# Patient Record
Sex: Male | Born: 1940 | Race: White | Hispanic: No | Marital: Single | Smoking: Never smoker
Health system: Southern US, Community
[De-identification: ages and names within clinical notes are randomized; demographics above are authoritative.]

## PROBLEM LIST (undated history)

## (undated) DIAGNOSIS — F840 Autistic disorder: Secondary | ICD-10-CM

## (undated) DIAGNOSIS — F73 Profound intellectual disabilities: Secondary | ICD-10-CM

## (undated) DIAGNOSIS — F319 Bipolar disorder, unspecified: Secondary | ICD-10-CM

## (undated) DIAGNOSIS — F4310 Post-traumatic stress disorder, unspecified: Secondary | ICD-10-CM

## (undated) DIAGNOSIS — K5900 Constipation, unspecified: Secondary | ICD-10-CM

## (undated) DIAGNOSIS — R32 Unspecified urinary incontinence: Secondary | ICD-10-CM

## (undated) DIAGNOSIS — M0520 Rheumatoid vasculitis with rheumatoid arthritis of unspecified site: Secondary | ICD-10-CM

## (undated) DIAGNOSIS — G40909 Epilepsy, unspecified, not intractable, without status epilepticus: Secondary | ICD-10-CM

## (undated) DIAGNOSIS — R159 Full incontinence of feces: Secondary | ICD-10-CM

## (undated) HISTORY — DX: Full incontinence of feces: R15.9

## (undated) HISTORY — DX: Bipolar disorder, unspecified: F31.9

## (undated) HISTORY — PX: NO PAST SURGERIES: SHX2092

## (undated) HISTORY — DX: Unspecified urinary incontinence: R32

## (undated) HISTORY — DX: Epilepsy, unspecified, not intractable, without status epilepticus: G40.909

## (undated) HISTORY — DX: Profound intellectual disabilities: F73

## (undated) HISTORY — DX: Autistic disorder: F84.0

## (undated) HISTORY — DX: Constipation, unspecified: K59.00

## (undated) HISTORY — DX: Post-traumatic stress disorder, unspecified: F43.10

## (undated) HISTORY — DX: Rheumatoid vasculitis with rheumatoid arthritis of unspecified site: M05.20

---

## 2012-11-28 ENCOUNTER — Ambulatory Visit (HOSPITAL_COMMUNITY)
Admission: RE | Admit: 2012-11-28 | Discharge: 2012-11-28 | Disposition: A | Discharge status: Home / Self Care | Payer: Medicare Other | Source: Ambulatory Visit | Attending: Internal Medicine | Admitting: Internal Medicine

## 2012-11-28 ENCOUNTER — Other Ambulatory Visit (HOSPITAL_COMMUNITY): Payer: Self-pay | Admitting: Internal Medicine

## 2012-11-28 DIAGNOSIS — G319 Degenerative disease of nervous system, unspecified: Secondary | ICD-10-CM | POA: Insufficient documentation

## 2012-11-28 DIAGNOSIS — W19XXXA Unspecified fall, initial encounter: Secondary | ICD-10-CM

## 2012-12-05 ENCOUNTER — Other Ambulatory Visit: Payer: Self-pay | Admitting: Internal Medicine

## 2012-12-05 DIAGNOSIS — R918 Other nonspecific abnormal finding of lung field: Secondary | ICD-10-CM

## 2012-12-05 DIAGNOSIS — R531 Weakness: Secondary | ICD-10-CM

## 2012-12-08 ENCOUNTER — Ambulatory Visit
Admission: RE | Admit: 2012-12-08 | Discharge: 2012-12-08 | Disposition: A | Discharge status: Home / Self Care | Payer: Medicare Other | Source: Ambulatory Visit | Attending: Internal Medicine | Admitting: Internal Medicine

## 2012-12-08 DIAGNOSIS — R918 Other nonspecific abnormal finding of lung field: Secondary | ICD-10-CM

## 2012-12-08 DIAGNOSIS — R531 Weakness: Secondary | ICD-10-CM

## 2012-12-15 ENCOUNTER — Ambulatory Visit (INDEPENDENT_AMBULATORY_CARE_PROVIDER_SITE_OTHER): Payer: Medicare Other | Admitting: Internal Medicine

## 2012-12-15 ENCOUNTER — Encounter: Payer: Self-pay | Admitting: Internal Medicine

## 2012-12-15 VITALS — BP 122/82 | HR 89 | Wt 156.6 lb

## 2012-12-15 DIAGNOSIS — R918 Other nonspecific abnormal finding of lung field: Secondary | ICD-10-CM | POA: Insufficient documentation

## 2012-12-15 DIAGNOSIS — R222 Localized swelling, mass and lump, trunk: Secondary | ICD-10-CM

## 2012-12-15 NOTE — Progress Notes (Signed)
Subjective:     Patient ID: Alan Rhodes, male   DOB: 08-Oct-1940  MRN: [id number]  HPI  9 yowm never smoker autistic and living in Group home since Feb 2014 with baseline totally unalble to verbalize complaints but able to stand/walk on his own including sometimes walk around the block referred 12/15/2012 to pulmonary clinic for eval of ? pna referred by UC/ Dr Allyne Gee  12/15/2012 1st pulmonary ov /cc abruptly ill November 27 2012  Unable to stand s assistance, poor appetite, some cough > yellow mucus and no fever > dx pna by UC and rx levaquin starting June 20th x 10 days but no improvement seen by staff.  Swallowing ok, never noted to cough worse p eating.  No apparent wob, no vomiting.  Has had density in RLL/ RML by cxr since 2005  Sleeping ok without nocturnal  or early am exacerbation  of respiratory  c/o's or need for noct saba. Also denies any obvious fluctuation of symptoms with weather or environmental changes or other aggravating or alleviating factors except as outlined above   Current Medications, Allergies, Past Medical History, Past Surgical History, Family History, and Social History were reviewed in [employer] record.  ROS  The following are not active complaints unless bolded sore throat, dysphagia, dental problems, itching, sneezing,  nasal congestion or excess/ purulent secretions, ear ache,   fever, chills, sweats, unintended wt loss, pleuritic or exertional cp, hemoptysis,  orthopnea pnd or leg swelling, presyncope, palpitations, heartburn, abdominal pain, anorexia, nausea, vomiting, diarrhea  or change in bowel or urinary habits, change in stools or urine, dysuria,hematuria,  rash, arthralgias, visual complaints, headache, numbness weakness or ataxia or problems with walking or coordination,  change in mood/affect or memory.       Review of Systems     Objective:   Physical Exam   wc bound passive wm nad with nl vital signs, chronically ill, occ  moans but no resp to verbal o/w  HEENT: nl dentition, turbinates, and orophanx. Nl external ear canals without cough reflex   NECK :  without JVD/Nodes/TM/ nl carotid upstrokes bilaterally   LUNGS: no acc muscle use, clear to A and P bilaterally without cough on insp or exp maneuvers   CV:  RRR  no s3 or murmur or increase in P2, no edema   ABD:  soft and nontender with nl excursion in the supine position. No bruits or organomegaly, bowel sounds nl  MS:  warm without deformities, calf tenderness, cyanosis or clubbing  SKIN: warm and dry without lesions    NEURO:  No focal deficits apparent though pt not cooperative      Assessment:

## 2012-12-15 NOTE — Patient Instructions (Addendum)
Stop actonel  Please schedule a follow up office visit in 4 weeks, sooner if needed with cxr on return

## 2012-12-16 NOTE — Assessment & Plan Note (Signed)
-   never smoker - RLL/RML density present since 2005 on plain cxr - ? New Pl effusion by CT Chest 12/08/12 Heterogeneous mass-like lesion in the right middle lobe with  foci of fat and calcification, possibly similar when today's scout  view is compared with prior radiograph performed on 09/17/2006.  Additional areas of fat density in the lower lobes bilaterally.  Collectively, findings may be due to lipoid pneumonia. However,  heterogeneous mass-like appearance, location in the right middle  lobe and adjacent right pleural effusion warrant consideration of  superimposed malignancy   Most likely he is chronically aspirating with possible lipoid pna or has a benign form of rml syndrome but the only treatment option in this setting is rx with abx as he is and conservative f/u to see if R effusion needs to be tapped at some point - even this would be technically very difficult given his level of understanding and cooperation.  He is a ward of the state and informed consent is not feasible.

## 2012-12-24 ENCOUNTER — Encounter (HOSPITAL_COMMUNITY): Payer: Self-pay | Admitting: *Deleted

## 2012-12-24 ENCOUNTER — Emergency Department (HOSPITAL_COMMUNITY): Payer: Medicare Other

## 2012-12-24 ENCOUNTER — Inpatient Hospital Stay (HOSPITAL_COMMUNITY)
Admission: EM | Admit: 2012-12-24 | Discharge: 2013-01-06 | DRG: 178 | Disposition: A | Discharge status: Skilled Nursing Facility | Payer: Medicare Other | Attending: Internal Medicine | Admitting: Internal Medicine

## 2012-12-24 DIAGNOSIS — F72 Severe intellectual disabilities: Secondary | ICD-10-CM | POA: Diagnosis present

## 2012-12-24 DIAGNOSIS — K219 Gastro-esophageal reflux disease without esophagitis: Secondary | ICD-10-CM | POA: Diagnosis present

## 2012-12-24 DIAGNOSIS — J189 Pneumonia, unspecified organism: Secondary | ICD-10-CM | POA: Diagnosis present

## 2012-12-24 DIAGNOSIS — R5381 Other malaise: Secondary | ICD-10-CM | POA: Diagnosis present

## 2012-12-24 DIAGNOSIS — G40909 Epilepsy, unspecified, not intractable, without status epilepticus: Secondary | ICD-10-CM | POA: Diagnosis present

## 2012-12-24 DIAGNOSIS — E785 Hyperlipidemia, unspecified: Secondary | ICD-10-CM | POA: Diagnosis present

## 2012-12-24 DIAGNOSIS — F73 Profound intellectual disabilities: Secondary | ICD-10-CM | POA: Diagnosis present

## 2012-12-24 DIAGNOSIS — M069 Rheumatoid arthritis, unspecified: Secondary | ICD-10-CM | POA: Diagnosis present

## 2012-12-24 DIAGNOSIS — J690 Pneumonitis due to inhalation of food and vomit: Principal | ICD-10-CM | POA: Diagnosis present

## 2012-12-24 DIAGNOSIS — E872 Acidosis: Secondary | ICD-10-CM

## 2012-12-24 DIAGNOSIS — E039 Hypothyroidism, unspecified: Secondary | ICD-10-CM | POA: Diagnosis present

## 2012-12-24 DIAGNOSIS — R918 Other nonspecific abnormal finding of lung field: Secondary | ICD-10-CM

## 2012-12-24 DIAGNOSIS — F840 Autistic disorder: Secondary | ICD-10-CM | POA: Diagnosis present

## 2012-12-24 DIAGNOSIS — R1310 Dysphagia, unspecified: Secondary | ICD-10-CM

## 2012-12-24 LAB — CBC WITH DIFFERENTIAL/PLATELET
Eosinophils Absolute: 0 10*3/uL (ref 0.0–0.7)
Eosinophils Relative: 0 % (ref 0–5)
HCT: 39.5 % (ref 39.0–52.0)
Lymphocytes Relative: 7 % — ABNORMAL LOW (ref 12–46)
Lymphs Abs: 0.8 10*3/uL (ref 0.7–4.0)
MCH: 34.2 pg — ABNORMAL HIGH (ref 26.0–34.0)
MCV: 97.1 fL (ref 78.0–100.0)
Monocytes Absolute: 2.9 10*3/uL — ABNORMAL HIGH (ref 0.1–1.0)
Platelets: 227 10*3/uL (ref 150–400)
RBC: 4.07 MIL/uL — ABNORMAL LOW (ref 4.22–5.81)
RDW: 16.7 % — ABNORMAL HIGH (ref 11.5–15.5)
WBC: 10.5 10*3/uL (ref 4.0–10.5)

## 2012-12-24 LAB — URINALYSIS, ROUTINE W REFLEX MICROSCOPIC
Nitrite: NEGATIVE
Protein, ur: NEGATIVE mg/dL
Specific Gravity, Urine: 1.03 (ref 1.005–1.030)
Urobilinogen, UA: 1 mg/dL (ref 0.0–1.0)

## 2012-12-24 LAB — COMPREHENSIVE METABOLIC PANEL
ALT: 12 U/L (ref 0–53)
AST: 18 U/L (ref 0–37)
Albumin: 1.8 g/dL — ABNORMAL LOW (ref 3.5–5.2)
Calcium: 9.2 mg/dL (ref 8.4–10.5)
GFR calc Af Amer: >90 mL/min (ref >90)
Glucose, Bld: 115 mg/dL — ABNORMAL HIGH (ref 70–99)
Potassium: 4 mEq/L (ref 3.5–5.1)
Sodium: 134 mEq/L — ABNORMAL LOW (ref 135–145)
Total Protein: 5.9 g/dL — ABNORMAL LOW (ref 6.0–8.3)

## 2012-12-24 LAB — VALPROIC ACID LEVEL: Valproic Acid Lvl: 102.6 ug/mL — ABNORMAL HIGH (ref 50.0–100.0)

## 2012-12-24 LAB — URINE MICROSCOPIC-ADD ON

## 2012-12-24 LAB — CG4 I-STAT (LACTIC ACID): Lactic Acid, Venous: 3.46 mmol/L — ABNORMAL HIGH (ref 0.5–2.2)

## 2012-12-24 LAB — HIV ANTIBODY (ROUTINE TESTING W REFLEX): HIV: NONREACTIVE

## 2012-12-24 MED ORDER — DOCUSATE SODIUM 100 MG PO CAPS
100.0000 mg | ORAL_CAPSULE | Freq: Two times a day (BID) | ORAL | Status: DC
  Administered 2012-12-24 – 2013-01-06 (×24): 100 mg via ORAL
  Filled 2012-12-24 (×27): qty 1

## 2012-12-24 MED ORDER — OXYBUTYNIN CHLORIDE 5 MG PO TABS
10.0000 mg | ORAL_TABLET | Freq: Three times a day (TID) | ORAL | Status: DC
  Administered 2012-12-24 – 2013-01-06 (×39): 10 mg via ORAL
  Filled 2012-12-24 (×41): qty 2

## 2012-12-24 MED ORDER — HEPARIN SODIUM (PORCINE) 5000 UNIT/ML IJ SOLN
5000.0000 [IU] | Freq: Three times a day (TID) | INTRAMUSCULAR | Status: DC
  Administered 2012-12-24 – 2013-01-06 (×35): 5000 [IU] via SUBCUTANEOUS
  Filled 2012-12-24 (×41): qty 1

## 2012-12-24 MED ORDER — SODIUM CHLORIDE 0.9 % IV SOLN: Freq: Once | INTRAVENOUS | Status: DC

## 2012-12-24 MED ORDER — PANTOPRAZOLE SODIUM 40 MG IV SOLR
40.0000 mg | Freq: Every day | INTRAVENOUS | Status: DC
  Administered 2012-12-24 – 2012-12-26 (×3): 40 mg via INTRAVENOUS
  Filled 2012-12-24 (×9): qty 40

## 2012-12-24 MED ORDER — DIVALPROEX SODIUM 500 MG PO DR TAB
500.0000 mg | DELAYED_RELEASE_TABLET | Freq: Every day | ORAL | Status: DC
  Administered 2012-12-25 – 2012-12-26 (×2): 500 mg via ORAL
  Filled 2012-12-24 (×2): qty 1

## 2012-12-24 MED ORDER — LEVOFLOXACIN IN D5W 750 MG/150ML IV SOLN
750.0000 mg | INTRAVENOUS | Status: DC
  Administered 2012-12-25: 750 mg via INTRAVENOUS
  Filled 2012-12-24 (×4): qty 150

## 2012-12-24 MED ORDER — FOLIC ACID 1 MG PO TABS
1.0000 mg | ORAL_TABLET | Freq: Every day | ORAL | Status: DC
  Administered 2012-12-24 – 2013-01-06 (×14): 1 mg via ORAL
  Filled 2012-12-24 (×14): qty 1

## 2012-12-24 MED ORDER — DIVALPROEX SODIUM 500 MG PO DR TAB: 500.0000 mg | DELAYED_RELEASE_TABLET | Freq: Every morning | ORAL | Status: DC

## 2012-12-24 MED ORDER — SODIUM CHLORIDE 0.9 % IV BOLUS (SEPSIS)
1000.0000 mL | Freq: Once | INTRAVENOUS | Status: AC
  Administered 2012-12-24: 1000 mL via INTRAVENOUS

## 2012-12-24 MED ORDER — POLYETHYLENE GLYCOL 3350 17 G PO PACK
17.0000 g | PACK | Freq: Every day | ORAL | Status: DC
  Administered 2012-12-24 – 2013-01-06 (×14): 17 g via ORAL
  Filled 2012-12-24 (×14): qty 1

## 2012-12-24 MED ORDER — RISPERIDONE 0.5 MG PO TABS
0.5000 mg | ORAL_TABLET | Freq: Every day | ORAL | Status: DC
  Administered 2012-12-25 – 2013-01-06 (×13): 0.5 mg via ORAL
  Filled 2012-12-24 (×13): qty 1

## 2012-12-24 MED ORDER — KCL IN DEXTROSE-NACL 20-5-0.45 MEQ/L-%-% IV SOLN
INTRAVENOUS | Status: DC
  Administered 2012-12-24: 17:00:00 via INTRAVENOUS
  Administered 2012-12-25: 1000 mL via INTRAVENOUS
  Filled 2012-12-24 (×25): qty 1000

## 2012-12-24 MED ORDER — DIVALPROEX SODIUM 500 MG PO DR TAB
1000.0000 mg | DELAYED_RELEASE_TABLET | Freq: Every day | ORAL | Status: DC
  Administered 2012-12-24 – 2012-12-25 (×2): 1000 mg via ORAL
  Filled 2012-12-24 (×3): qty 2

## 2012-12-24 MED ORDER — PNEUMOCOCCAL VAC POLYVALENT 25 MCG/0.5ML IJ INJ
0.5000 mL | INJECTION | INTRAMUSCULAR | Status: AC
  Administered 2012-12-25: 0.5 mL via INTRAMUSCULAR
  Filled 2012-12-24 (×2): qty 0.5

## 2012-12-24 MED ORDER — LEVOFLOXACIN IN D5W 500 MG/100ML IV SOLN
500.0000 mg | Freq: Once | INTRAVENOUS | Status: AC
  Administered 2012-12-24: 500 mg via INTRAVENOUS
  Filled 2012-12-24: qty 100

## 2012-12-24 MED ORDER — LORAZEPAM 0.5 MG PO TABS: 0.5000 mg | ORAL_TABLET | Freq: Three times a day (TID) | ORAL | Status: DC | PRN

## 2012-12-24 MED ORDER — RISPERIDONE 0.5 MG PO TABS: 0.5000 mg | ORAL_TABLET | Freq: Every morning | ORAL | Status: DC

## 2012-12-24 MED ORDER — LEVOTHYROXINE SODIUM 25 MCG PO TABS
25.0000 ug | ORAL_TABLET | Freq: Every day | ORAL | Status: DC
  Administered 2012-12-25 – 2013-01-06 (×13): 25 ug via ORAL
  Filled 2012-12-24 (×16): qty 1

## 2012-12-24 MED ORDER — POLYETHYLENE GLYCOL 3350 17 GM/SCOOP PO POWD
17.0000 g | Freq: Every day | ORAL | Status: DC
  Filled 2012-12-24: qty 255

## 2012-12-24 MED ORDER — SIMVASTATIN 5 MG PO TABS
5.0000 mg | ORAL_TABLET | Freq: Every day | ORAL | Status: DC
  Administered 2012-12-24 – 2013-01-05 (×12): 5 mg via ORAL
  Filled 2012-12-24 (×14): qty 1

## 2012-12-24 MED ORDER — RISPERIDONE 1 MG PO TABS
1.0000 mg | ORAL_TABLET | Freq: Every day | ORAL | Status: DC
  Administered 2012-12-24 – 2013-01-05 (×13): 1 mg via ORAL
  Filled 2012-12-24 (×14): qty 1

## 2012-12-24 MED ORDER — OXCARBAZEPINE 300 MG PO TABS
300.0000 mg | ORAL_TABLET | Freq: Two times a day (BID) | ORAL | Status: DC
  Administered 2012-12-24 – 2013-01-06 (×26): 300 mg via ORAL
  Filled 2012-12-24 (×28): qty 1

## 2012-12-24 NOTE — H&P (Signed)
[facility] History and Physical  Alan Rhodes:[id number] DOB: 1940/07/15 DOA: 12/24/2012  Referring physician: Dr. Rhunette Croft PCP: Galvin Proffer, MD  Specialists: none  Chief Complaint: cough  HPI: Alan Rhodes is a 72 y.o. male With Autism, severe MR, Seizure d/o, and Rheumatoid arthritis that presents to the ED after 1 week of cough. Patient does not hold conversations with people at baseline.  Care giver reports that patient has not been as active as usual.  Reports a decrease in oral intake and a non productive cough.   While in the ED patient had chest x ray reported as new ill-defined opacity at the left base, which may represent pneumonia. Patient was treated with levaquin in the ED and we were consulted for further evaluation and recommendations for CAP.   Review of Systems: Patient with severe MR and non verbal as such unable to obtain  Past Medical History  Diagnosis Date  . Autism   . PTSD (post-traumatic stress disorder)   . Bipolar disorder, unspecified   . Profound mental retardation   . Seizure disorder   . Rheumatoid arteritis   . Constipation   . Incontinence of bowel   . Bladder incontinence    History reviewed. No pertinent past surgical history. Social History:  reports that he has never smoked. He has never used smokeless tobacco. He reports that he does not drink alcohol or use illicit drugs. Lives at assisted living facility  Can patient participate in ADLs? Not currently  No Known Allergies  History reviewed. No pertinent family history. None reported.  Prior to Admission medications   Medication Sig Start Date End Date Taking? Authorizing Provider  Calcium Carbonate-Vitamin D (CALCARB 600/D) 600-400 MG-UNIT per tablet Take 1 tablet by mouth 3 (three) times daily with meals.   Yes Historical Provider, MD  divalproex (DEPAKOTE) 500 MG DR tablet Take 500 mg by mouth every morning. And 1000 mg at bedtime   Yes Historical Provider, MD   docusate sodium (COLACE) 100 MG capsule Take 100 mg by mouth 2 (two) times daily.   Yes Historical Provider, MD  folic acid (FOLVITE) 1 MG tablet Take 1 mg by mouth daily.   Yes Historical Provider, MD  levothyroxine (SYNTHROID, LEVOTHROID) 25 MCG tablet Take 25 mcg by mouth daily before breakfast.   Yes Historical Provider, MD  LORazepam (ATIVAN) 0.5 MG tablet Take 0.5 mg by mouth 3 (three) times daily as needed for anxiety.   Yes Historical Provider, MD  lovastatin (MEVACOR) 20 MG tablet Take 20 mg by mouth at bedtime.   Yes Historical Provider, MD  methotrexate (RHEUMATREX) 2.5 MG tablet Take 12.5 mg by mouth once a week. Caution:Chemotherapy. Protect from light.  5 tabs   Yes Historical Provider, MD  OXcarbazepine (TRILEPTAL) 150 MG tablet Take 300 mg by mouth 2 (two) times daily.   Yes Historical Provider, MD  oxybutynin (DITROPAN) 5 MG tablet Take 10 mg by mouth 3 (three) times daily.   Yes Historical Provider, MD  polyethylene glycol powder (MIRALAX) powder Take 17 g by mouth daily.   Yes Historical Provider, MD  predniSONE (DELTASONE) 1 MG tablet Take 1 mg by mouth daily.   Yes Historical Provider, MD  risperiDONE (RISPERDAL) 0.5 MG tablet Take 0.5 mg by mouth every morning. And 2 tablets at bedtime   Yes Historical Provider, MD   Physical Exam: Filed Vitals:   12/24/12 1040 12/24/12 1121 12/24/12 1423  BP: 125/75  114/64  Pulse: 124 113 77  Temp: 99 F (37.2 C) 100.1 F (37.8 C)   TempSrc: Oral Rectal   Resp: 16  18  SpO2: 97%  99%     General:  Pt in NAD, Alert and Awake  Eyes: EOMI, non icteric  ENT: normal exterior appearance  Neck: supple, no goiter  Cardiovascular: RRR, no MRG  Respiratory: CTA BL, no wheezes  Abdomen: soft, NT, ND  Skin: warm and dry  Musculoskeletal: no cyanosis or clubbing  Psychiatric: unable to properly assess due to severe MR  Neurologic: unable to properly assess due to severe MR, no facial asymmetry  Labs on Admission:  Basic  Metabolic Panel:  Recent Labs Lab 12/24/12 1230  NA 134*  K 4.0  CL 101  CO2 26  GLUCOSE 115*  BUN 14  CREATININE 0.88  CALCIUM 9.2   Liver Function Tests:  Recent Labs Lab 12/24/12 1230  AST 18  ALT 12  ALKPHOS 81  BILITOT 0.3  PROT 5.9*  ALBUMIN 1.8*   No results found for this basename: LIPASE, AMYLASE,  in the last 168 hours No results found for this basename: AMMONIA,  in the last 168 hours CBC:  Recent Labs Lab 12/24/12 1137  WBC 10.5  NEUTROABS 6.7  HGB 13.9  HCT 39.5  MCV 97.1  PLT 227   Cardiac Enzymes: No results found for this basename: CKTOTAL, CKMB, CKMBINDEX, TROPONINI,  in the last 168 hours  BNP (last 3 results) No results found for this basename: PROBNP,  in the last 8760 hours CBG: No results found for this basename: GLUCAP,  in the last 168 hours  Radiological Exams on Admission: Dg Chest 2 View  12/24/2012   *RADIOLOGY REPORT*  Clinical Data: Chest pain.  CHEST - 2 VIEW  Comparison: Chest CT 12/08/2012.  Findings: Redemonstrated, circumscribed 4 cm mass in the right middle lobe.  Prior CT better characterized central fat and calcification, possibly hamartoma or lipoid pneumonia.  Ill defined opacity at the left base is new from prior.  Small bilateral pleural effusions.  Unchanged prominence of the ascending aorta, which is elongated and accentuated by rightward rotation.  No change in heart size.  Lower thoracic compression fracture, present since at least December 05, 2012.  IMPRESSION:  1.  New ill-defined opacity at the left base, which may represent pneumonia in the appropriate clinical setting.  2.  Right middle lobe mass, reference chest CT 12/08/2012. 3.  Small bilateral pleural effusions.   Original Report Authenticated By: Tiburcio Pea    Assessment/Plan Active Problems:  1. CAP - Levaquin - urine legionella and strep antigen - sputum culture if able to obtain - speech therapy evaluation to assess for aspiration  2 . Rheumatoid  arthritis - discontinue methotrexate and prednisone given # 1  3. Seizure d/o - Will go ahead and continue home regimen  4. Hypothyroidism - continue home regimen. - obtain tsh  5. HPL - stable continue statin  6. DVT prophylaxis - heparin   Code Status: presumed full caregiver was not able to specify  Family Communication: No family at bedside. Disposition Plan: Pending improvement in condition  Time spent: > 65 minutes  [name] [facility] Pager [phone]  If 7PM-7AM, please contact night-coverage [email or web address] Password [facility] 12/24/2012, 3:18 PM

## 2012-12-24 NOTE — Evaluation (Signed)
Clinical/Bedside Swallow Evaluation Patient Details  Name: Alan Rhodes MRN: [id number] Date of Birth: July 15, 1940  Today's Date: 12/24/2012 Time: 0102-7253 SLP Time Calculation (min): 28 min  Past Medical History:  Past Medical History  Diagnosis Date  . Autism   . PTSD (post-traumatic stress disorder)   . Bipolar disorder, unspecified   . Profound mental retardation   . Seizure disorder   . Rheumatoid arteritis   . Constipation   . Incontinence of bowel   . Bladder incontinence    Past Surgical History:  Past Surgical History  Procedure Laterality Date  . No past surgeries     HPI:  Alan Rhodes is a 72 y.o. male with Autism, severe MR, Seizure d/o, and Rheumatoid arthritis that presents to the ED after 1 week of cough. Patient does not hold conversations with people at baseline. Care giver reports that patient has not been as active as usual. Reports a decrease in oral intake and a non productive cough. CXR shows new opacity at left base. Dr Sherene Sires has a note in chart that reads "Most likely he is chronically aspirating with possible lipoid pna or has a benign form of rml syndrome but the only treatment option in this setting is rx with abx as he is and conservative f/u to see if R effusion needs to be tapped at some point - even this would be technically very difficult given his level of understanding and cooperation.  He is a ward of the state and informed consent is not feasible."   Assessment / Plan / Recommendation Clinical Impression  Pt demonstrates signs concerning for an esophageal dysphagia. Initially when being fed by caregiver, pts swallow function appears [facility]. After 5 bites and sips, pt begins coughing, gagging and belching. Suspect pt may have sensation of reflux which could also be contributing factor in pna. Caregivers confirm that pt has had a frequent cough even before pna became a problem. Suggest pt initate a puree/thin liquid diet with esophageal  precautions.  Discussed with MD who will place on diet. Provided verbal and written suggestions to caregiver. Will f/u for further differential dx of pharyngeal vs esophageal dysphagia and education with caregivers.     Aspiration Risk  Moderate    Diet Recommendation Dysphagia 1 (Puree);Thin liquid   Liquid Administration via: Cup Medication Administration: Whole meds with puree Supervision: Trained caregiver to feed patient;Staff feed patient Compensations: Slow rate;Small sips/bites;Follow solids with liquid Postural Changes and/or Swallow Maneuvers: Seated upright 90 degrees;Upright 30-60 min after meal    Other  Recommendations Oral Care Recommendations: Oral care BID   Follow Up Recommendations  None    Frequency and Duration min 2x/week  2 weeks   Pertinent Vitals/Pain NA    SLP Swallow Goals Patient will utilize recommended strategies during swallow to increase swallowing safety with: Total assistance Swallow Study Goal #2 - Progress: Progressing toward goal   Swallow Study Prior Functional Status       General HPI: Alan Rhodes is a 72 y.o. male with Autism, severe MR, Seizure d/o, and Rheumatoid arthritis that presents to the ED after 1 week of cough. Patient does not hold conversations with people at baseline. Care giver reports that patient has not been as active as usual. Reports a decrease in oral intake and a non productive cough. CXR shows new opacity at left base. Dr Sherene Sires has a note in chart that reads "Most likely he is chronically aspirating with possible lipoid pna or has a benign  form of rml syndrome but the only treatment option in this setting is rx with abx as he is and conservative f/u to see if R effusion needs to be tapped at some point - even this would be technically very difficult given his level of understanding and cooperation.  He is a ward of the state and informed consent is not feasible." Type of Study: Bedside swallow evaluation Previous  Swallow Assessment: none Diet Prior to this Study: Dysphagia 1 (puree);Thin liquids Temperature Spikes Noted: Yes Respiratory Status: Room air History of Recent Intubation: No Behavior/Cognition: Alert;Cooperative;Requires cueing;Pleasant mood Self-Feeding Abilities: Total assist Patient Positioning: Upright in bed Baseline Vocal Quality: Clear Volitional Cough: Cognitively unable to elicit Volitional Swallow: Unable to elicit    Oral/Motor/Sensory Function Overall Oral Motor/Sensory Function: Other (comment) (Did not attempt)   Ice Chips     Thin Liquid Thin Liquid: Impaired Presentation: Cup Oral Phase Functional Implications: Prolonged oral transit;Oral holding Pharyngeal  Phase Impairments: Cough - Delayed    Nectar Thick Nectar Thick Liquid: Not tested   Honey Thick Honey Thick Liquid: Not tested   Puree Puree: Impaired Presentation: Spoon Oral Phase Functional Implications: Prolonged oral transit;Oral holding Pharyngeal Phase Impairments: Cough - Delayed   Solid   GO    Solid: Not tested      Harlon Ditty, MA CCC-SLP [phone]  Claudine Mouton 12/24/2012,4:48 PM

## 2012-12-24 NOTE — Progress Notes (Signed)
ANTIBIOTIC CONSULT NOTE - INITIAL  Pharmacy Consult for Levaquin, Antibiotic renal dose adjustment Indication: pneumonia  No Known Allergies  Patient Measurements: Height: 5\' 3"  (160 cm) Weight: 154 lb 8.7 oz (70.1 kg) IBW/kg (Calculated) : 56.9 Adjusted Body Weight:   Vital Signs: Temp: 97.8 F (36.6 C) (07/09 1536) Temp src: Oral (07/09 1536) BP: 125/80 mmHg (07/09 1536) Pulse Rate: 78 (07/09 1536) Intake/Output from previous day:   Intake/Output from this shift:    Labs:  Recent Labs  12/24/12 1137 12/24/12 1230  WBC 10.5  --   HGB 13.9  --   PLT 227  --   CREATININE  --  0.88   Estimated Creatinine Clearance: 68.7 ml/min (by C-G formula based on Cr of 0.88). No results found for this basename: VANCOTROUGH, VANCOPEAK, VANCORANDOM, GENTTROUGH, GENTPEAK, GENTRANDOM, TOBRATROUGH, TOBRAPEAK, TOBRARND, AMIKACINPEAK, AMIKACINTROU, AMIKACIN,  in the last 72 hours   Microbiology: No results found for this or any previous visit (from the past 720 hour(s)).  Medical History: Past Medical History  Diagnosis Date  . Autism   . PTSD (post-traumatic stress disorder)   . Bipolar disorder, unspecified   . Profound mental retardation   . Seizure disorder   . Rheumatoid arteritis   . Constipation   . Incontinence of bowel   . Bladder incontinence     Assessment: 47 yoM presents to ED after 1 week of cough.  ED CXR shows opacity which may represent pneumonia.  Starting Levaquin for CAP.  Received 500 mg IV Levaquin in ED.  Afebrile, WBC 10.5  Renal function WNL.  CrCl~68 ml/min.  Goal of Therapy:  Eradication of infection  Plan:  Levaquin 750 mg IV q24h.   Clance Boll 12/24/2012,4:37 PM

## 2012-12-24 NOTE — ED Provider Notes (Addendum)
History    CSN: [id number] Arrival date & time 12/24/12  1022  First MD Initiated Contact with Patient 12/24/12 1054     Chief Complaint  Patient presents with  . weakness, change in mental status since June    (Consider location/radiation/quality/duration/timing/severity/associated sxs/prior Treatment) HPI Comments: LEVEL 5 CAVEAT FOR INABILITY TO COMMUNICATE DUE TO AUTISM 72 y/o male, hx of autism, living at a group home, brought in to the ED by the group home with cc of abd pain. Per group home representative, patient has been getting worse over the past 1 month. He was seen at urgent care, and was treated with levaquin for possible PNA. The patient has been not eating or drinking, has decreased urine output, and lays in the bed for entire day. He is weak, not ambulating as he usually does, and hardly talks. No falls.   The history is provided by a caregiver.   Past Medical History  Diagnosis Date  . Autism   . PTSD (post-traumatic stress disorder)   . Bipolar disorder, unspecified   . Profound mental retardation   . Seizure disorder   . Rheumatoid arteritis   . Constipation   . Incontinence of bowel   . Bladder incontinence    History reviewed. No pertinent past surgical history. History reviewed. No pertinent family history. History  Substance Use Topics  . Smoking status: Never Smoker   . Smokeless tobacco: Never Used  . Alcohol Use: No    Review of Systems  Unable to perform ROS: Other    Allergies  Review of patient's allergies indicates no known allergies.  Home Medications   Current Outpatient Rx  Name  Route  Sig  Dispense  Refill  . Calcium Carbonate-Vitamin D (CALCARB 600/D) 600-400 MG-UNIT per tablet   Oral   Take 1 tablet by mouth 3 (three) times daily with meals.         . divalproex (DEPAKOTE) 500 MG DR tablet   Oral   Take 500 mg by mouth every morning. And 1000 mg at bedtime         . docusate sodium (COLACE) 100 MG capsule   Oral  Take 100 mg by mouth 2 (two) times daily.         . folic acid (FOLVITE) 1 MG tablet   Oral   Take 1 mg by mouth daily.         Marland Kitchen levothyroxine (SYNTHROID, LEVOTHROID) 25 MCG tablet   Oral   Take 25 mcg by mouth daily before breakfast.         . LORazepam (ATIVAN) 0.5 MG tablet   Oral   Take 0.5 mg by mouth 3 (three) times daily as needed for anxiety.         . lovastatin (MEVACOR) 20 MG tablet   Oral   Take 20 mg by mouth at bedtime.         . methotrexate (RHEUMATREX) 2.5 MG tablet   Oral   Take 12.5 mg by mouth once a week. Caution:Chemotherapy. Protect from light.  5 tabs         . OXcarbazepine (TRILEPTAL) 150 MG tablet   Oral   Take 300 mg by mouth 2 (two) times daily.         Marland Kitchen oxybutynin (DITROPAN) 5 MG tablet   Oral   Take 10 mg by mouth 3 (three) times daily.         . polyethylene glycol powder (MIRALAX) powder   Oral  Take 17 g by mouth daily.         . predniSONE (DELTASONE) 1 MG tablet   Oral   Take 1 mg by mouth daily.         . risperiDONE (RISPERDAL) 0.5 MG tablet   Oral   Take 0.5 mg by mouth every morning. And 2 tablets at bedtime          BP 125/75  Pulse 113  Temp(Src) 100.1 F (37.8 C) (Rectal)  Resp 16  SpO2 97% Physical Exam  Nursing note and vitals reviewed. HENT:  Head: Normocephalic.  Eyes: EOM are normal.  Neck: Neck supple. No JVD present.  Cardiovascular: Regular rhythm.   No murmur heard. Pulmonary/Chest: No respiratory distress.  Abdominal: Soft. He exhibits no distension. There is no tenderness.  Musculoskeletal: He exhibits no edema.  Neurological: He is alert.  Skin: No rash noted.    ED Course  Procedures (including critical care time) Labs Reviewed  CBC WITH DIFFERENTIAL - Abnormal; Notable for the following:    RBC 4.07 (*)    MCH 34.2 (*)    RDW 16.7 (*)    Lymphocytes Relative 7 (*)    Monocytes Relative 28 (*)    Monocytes Absolute 2.9 (*)    All other components within normal  limits  CG4 I-STAT (LACTIC ACID) - Abnormal; Notable for the following:    Lactic Acid, Venous 3.46 (*)    All other components within normal limits  URINE CULTURE  CULTURE, BLOOD (ROUTINE X 2)  CULTURE, BLOOD (ROUTINE X 2)  URINALYSIS, ROUTINE W REFLEX MICROSCOPIC  COMPREHENSIVE METABOLIC PANEL   No results found. No diagnosis found.  MDM  DDx: Sepsis syndrome ACS syndrome DKA ICH/Stroke Infection - pneumonia/UTI/Cellulitis PE Dehydration Electrolyte abnormality Tox syndrome  Pt comes in with cc of weakness. Tachycardic here. Unable to provide hx, and exam not suggestive of any infection. No rash, pressure ulcers appreciated.  Group home indicates that he has had decreased po intake with the weakness. They feel ill equipped to take care of him, as he continues to decline in front of them.  We will get basic screening labs. Will hydrate. Group home doesn't feel comfortable taking care of him, so might need admission and SNF placement.    Derwood Kaplan, MD 12/24/12 1304   Date: 12/24/2012  Rate: 92  Rhythm: normal sinus rhythm  QRS Axis: normal  Intervals: normal  ST/T Wave abnormalities: normal  Conduction Disutrbances: none  Narrative Interpretation: unremarkable      Derwood Kaplan, MD 12/24/12 1322

## 2012-12-24 NOTE — ED Notes (Addendum)
Staff from [facility] reports pt was able to walk, talk, alert and oriented x4. June 12 pt taken to urgent care for altered mental status, not eating, difficulty walking, sob when walking. At urgent care dx with UTI and pneumonia. Given abx and treated. Pt status did not improve. Facility was required to wait 1 week before bringing pt to ED. Pt at present not alert and oriented, unable to walk or sit up straight, cough present.

## 2012-12-24 NOTE — Progress Notes (Signed)
Pt unable to sign up for My Chart. Proxy forms were given to care giver to take back to group home in case anyone there is eligible to sign up for My Chart for patient.  Briscoe Burns BSN, RN-BC Admissions RN  12/24/2012 4:18 PM

## 2012-12-24 NOTE — ED Notes (Signed)
Critical I stat Lactic - [place] reported to Dr Rhunette Croft 1200

## 2012-12-25 LAB — BASIC METABOLIC PANEL
BUN: 11 mg/dL (ref 6–23)
Calcium: 8.6 mg/dL (ref 8.4–10.5)
Creatinine, Ser: 0.88 mg/dL (ref 0.50–1.35)
GFR calc non Af Amer: 85 mL/min — ABNORMAL LOW (ref >90)
Glucose, Bld: 85 mg/dL (ref 70–99)
Sodium: 134 mEq/L — ABNORMAL LOW (ref 135–145)

## 2012-12-25 LAB — CBC
HCT: 32.7 % — ABNORMAL LOW (ref 39.0–52.0)
Hemoglobin: 11.4 g/dL — ABNORMAL LOW (ref 13.0–17.0)
MCH: 34.5 pg — ABNORMAL HIGH (ref 26.0–34.0)
MCH: 33.9 pg (ref 26.0–34.0)
MCV: 97.1 fL (ref 78.0–100.0)
MCV: 97.3 fL (ref 78.0–100.0)
RBC: 2.78 MIL/uL — ABNORMAL LOW (ref 4.22–5.81)
RBC: 3.36 MIL/uL — ABNORMAL LOW (ref 4.22–5.81)

## 2012-12-25 LAB — URINE CULTURE
Colony Count: NO GROWTH
Culture: NO GROWTH

## 2012-12-25 MED ORDER — ENSURE COMPLETE PO LIQD
237.0000 mL | Freq: Three times a day (TID) | ORAL | Status: DC
  Administered 2012-12-25 – 2013-01-05 (×26): 237 mL via ORAL

## 2012-12-25 NOTE — Progress Notes (Signed)
INITIAL NUTRITION ASSESSMENT  DOCUMENTATION CODES Per approved criteria  -Not Applicable   INTERVENTION: Provide Ensure Complete TID  NUTRITION DIAGNOSIS: Inadequate oral intake related to medical condition as evidenced by pt eating 0-50% of meals.   Goal: Pt to meet >/= 90% of their estimated nutrition needs  Monitor:  PO intake Weight Labs  Reason for Assessment: Malnutrition Screening Tool, score of 2  72 y.o. male  Admitting Dx: CAP (community acquired pneumonia)  ASSESSMENT: 72 y.o. male with Autism, severe MR, Seizure d/o, and Rheumatoid arthritis that presents to the ED after 1 week of cough. Patient does not hold conversations with people at baseline. Care giver reports that patient has not been as active as usual. Reports a decrease in oral intake and a non productive cough.  Per RN pt refused to eat breakfast. PT was working with pt at time of visit and reports she fed pt a few bites from breakfast tray. Per nursing notes pt ate 50% of lunch. Per MST report pt has lost 7 lbs in the past 3 weeks.  Height: Ht Readings from Last 1 Encounters:  12/24/12 5\' 3"  (1.6 m)    Weight: Wt Readings from Last 1 Encounters:  12/24/12 154 lb 8.7 oz (70.1 kg)    Ideal Body Weight: 124 lbs  % Ideal Body Weight: 124%  Wt Readings from Last 10 Encounters:  12/24/12 154 lb 8.7 oz (70.1 kg)  12/15/12 156 lb 9.6 oz (71.033 kg)    Usual Body Weight: 161 lbs  % Usual Body Weight: 96%  BMI:  Body mass index is 27.38 kg/(m^2).  Estimated Nutritional Needs: Kcal: 1620-1890 Protein: 70-84 grams Fluid: 2.1 L  Skin: WDL  Diet Order: Dysphagia  EDUCATION NEEDS: -No education needs identified at this time   Intake/Output Summary (Last 24 hours) at 12/25/12 1432 Last data filed at 12/25/12 1231  Gross per 24 hour  Intake 2051.25 ml  Output      0 ml  Net 2051.25 ml    Last BM: 7/10  Labs:   Recent Labs Lab 12/24/12 1230 12/25/12 0502  NA 134* 134*  K 4.0  4.0  CL 101 103  CO2 26 27  BUN 14 11  CREATININE 0.88 0.88  CALCIUM 9.2 8.6  GLUCOSE 115* 85    CBG (last 3)  No results found for this basename: GLUCAP,  in the last 72 hours  Scheduled Meds: . sodium chloride   Intravenous Once  . divalproex  500 mg Oral Daily   And  . divalproex  1,000 mg Oral QHS  . docusate sodium  100 mg Oral BID  . folic acid  1 mg Oral Daily  . heparin  5,000 Units Subcutaneous Q8H  . levofloxacin (LEVAQUIN) IV  750 mg Intravenous Q24H  . levothyroxine  25 mcg Oral QAC breakfast  . OXcarbazepine  300 mg Oral BID  . oxybutynin  10 mg Oral TID  . pantoprazole (PROTONIX) IV  40 mg Intravenous Daily  . polyethylene glycol  17 g Oral Daily  . risperiDONE  0.5 mg Oral Daily   And  . risperiDONE  1 mg Oral QHS  . simvastatin  5 mg Oral q1800    Continuous Infusions: . dextrose 5 % and 0.45 % NaCl with KCl 20 mEq/L 1,000 mL (12/25/12 0550)    Past Medical History  Diagnosis Date  . Autism   . PTSD (post-traumatic stress disorder)   . Bipolar disorder, unspecified   . Profound mental retardation   .  Seizure disorder   . Rheumatoid arteritis   . Constipation   . Incontinence of bowel   . Bladder incontinence     Past Surgical History  Procedure Laterality Date  . No past surgeries      Ian Malkin RD, LDN Inpatient Clinical Dietitian Pager: [phone] After Hours Pager: [phone]

## 2012-12-25 NOTE — Progress Notes (Signed)
[facility] PROGRESS NOTE  Alan Rhodes XBM:[id number] DOB: 1940-09-05 DOA: 12/24/2012 PCP: Galvin Proffer, MD  Assessment/Plan:  1. CAP - Levaquin  - urine legionella antigen pending and strep antigen negative - sputum culture if able to obtain  - speech therapy evaluation completed and dysphagia 1 diet recommended.  Given this findings I suspect that aspiration may be a possible cause. - Also treating for GERD as this may be contributing to clinical picture. - Physical therapy has evaluated patient and is currently recommending SNF  2 . Rheumatoid arthritis  - Will continue to hold methotrexate and prednisone given # 1   3. Seizure d/o  - Will go ahead and continue home regimen   4. Hypothyroidism  - continue home regimen.  - obtain tsh   5. HPL  - stable continue statin   6. DVT prophylaxis  - heparin  Code Status: presumed full Family Communication: no family at bedside Disposition Plan: Will plan on discharging to SNF   Consultants:  Physical therapy  Speech therapy  Procedures:  none  Antibiotics:  Levaquin   HPI/Subjective: No acute issues reported overnight.  Objective: Filed Vitals:   12/24/12 1536 12/24/12 2157 12/25/12 0600 12/25/12 1348  BP: 125/80 109/76 100/59 104/72  Pulse: 78 62 69 74  Temp: 97.8 F (36.6 C) 98.4 F (36.9 C) 98.6 F (37 C) 97.9 F (36.6 C)  TempSrc: Oral Oral Oral Oral  Resp: 18 16 18 16   Height: 5\' 3"  (1.6 m)     Weight: 70.1 kg (154 lb 8.7 oz)     SpO2: 97% 96% 94% 97%    Intake/Output Summary (Last 24 hours) at 12/25/12 1407 Last data filed at 12/25/12 1231  Gross per 24 hour  Intake 2051.25 ml  Output      0 ml  Net 2051.25 ml   Filed Weights   12/24/12 1536  Weight: 70.1 kg (154 lb 8.7 oz)    Exam:   General:  Pt in NAD, Alert and Awake  Cardiovascular: RRR, no MRG  Respiratory: No wheezes, no increased work of breathing.  Abdomen: soft, NT, ND  Musculoskeletal: no cyanosis or  clubbing   Data Reviewed: Basic Metabolic Panel:  Recent Labs Lab 12/24/12 1230 12/25/12 0502  NA 134* 134*  K 4.0 4.0  CL 101 103  CO2 26 27  GLUCOSE 115* 85  BUN 14 11  CREATININE 0.88 0.88  CALCIUM 9.2 8.6   Liver Function Tests:  Recent Labs Lab 12/24/12 1230  AST 18  ALT 12  ALKPHOS 81  BILITOT 0.3  PROT 5.9*  ALBUMIN 1.8*   No results found for this basename: LIPASE, AMYLASE,  in the last 168 hours No results found for this basename: AMMONIA,  in the last 168 hours CBC:  Recent Labs Lab 12/24/12 1137 12/25/12 0502 12/25/12 1238  WBC 10.5 8.9 9.4  NEUTROABS 6.7  --   --   HGB 13.9 9.6* 11.4*  HCT 39.5 27.0* 32.7*  MCV 97.1 97.1 97.3  PLT 227 125* 148*   Cardiac Enzymes: No results found for this basename: CKTOTAL, CKMB, CKMBINDEX, TROPONINI,  in the last 168 hours BNP (last 3 results) No results found for this basename: PROBNP,  in the last 8760 hours CBG: No results found for this basename: GLUCAP,  in the last 168 hours  Recent Results (from the past 240 hour(s))  CULTURE, BLOOD (ROUTINE X 2)     Status: None   Collection Time  12/24/12 11:43 AM      Result Value Range Status   Specimen Description BLOOD RIGHT FOREARM   Final   Special Requests BOTTLES DRAWN AEROBIC AND ANAEROBIC 5 CC EACH   Final   Culture  Setup Time 12/24/2012 14:15   Final   Culture     Final   Value:        BLOOD CULTURE RECEIVED NO GROWTH TO DATE CULTURE WILL BE HELD FOR 5 DAYS BEFORE ISSUING A FINAL NEGATIVE REPORT   Report Status PENDING   Incomplete  CULTURE, BLOOD (ROUTINE X 2)     Status: None   Collection Time    12/24/12 12:30 PM      Result Value Range Status   Specimen Description BLOOD LEFT ARM   Final   Special Requests BOTTLES DRAWN AEROBIC AND ANAEROBIC 5 CC EACH   Final   Culture  Setup Time 12/24/2012 14:16   Final   Culture     Final   Value:        BLOOD CULTURE RECEIVED NO GROWTH TO DATE CULTURE WILL BE HELD FOR 5 DAYS BEFORE ISSUING A FINAL  NEGATIVE REPORT   Report Status PENDING   Incomplete     Studies: Dg Chest 2 View  12/24/2012   *RADIOLOGY REPORT*  Clinical Data: Chest pain.  CHEST - 2 VIEW  Comparison: Chest CT 12/08/2012.  Findings: Redemonstrated, circumscribed 4 cm mass in the right middle lobe.  Prior CT better characterized central fat and calcification, possibly hamartoma or lipoid pneumonia.  Ill defined opacity at the left base is new from prior.  Small bilateral pleural effusions.  Unchanged prominence of the ascending aorta, which is elongated and accentuated by rightward rotation.  No change in heart size.  Lower thoracic compression fracture, present since at least December 05, 2012.  IMPRESSION:  1.  New ill-defined opacity at the left base, which may represent pneumonia in the appropriate clinical setting.  2.  Right middle lobe mass, reference chest CT 12/08/2012. 3.  Small bilateral pleural effusions.   Original Report Authenticated By: Tiburcio Pea    Scheduled Meds: . sodium chloride   Intravenous Once  . divalproex  500 mg Oral Daily   And  . divalproex  1,000 mg Oral QHS  . docusate sodium  100 mg Oral BID  . folic acid  1 mg Oral Daily  . heparin  5,000 Units Subcutaneous Q8H  . levofloxacin (LEVAQUIN) IV  750 mg Intravenous Q24H  . levothyroxine  25 mcg Oral QAC breakfast  . OXcarbazepine  300 mg Oral BID  . oxybutynin  10 mg Oral TID  . pantoprazole (PROTONIX) IV  40 mg Intravenous Daily  . polyethylene glycol  17 g Oral Daily  . risperiDONE  0.5 mg Oral Daily   And  . risperiDONE  1 mg Oral QHS  . simvastatin  5 mg Oral q1800   Continuous Infusions: . dextrose 5 % and 0.45 % NaCl with KCl 20 mEq/L 1,000 mL (12/25/12 0550)    Principal Problem:   CAP (community acquired pneumonia) Active Problems:   MR (mental retardation), severe   Seizure disorder   Autism   Rheumatoid arthritis    Time spent: > 35 minutes    [name]  [facility] Pager [phone]. If 7PM-7AM, please  contact night-coverage at [email or web address], password [facility] 12/25/2012, 2:07 PM  LOS: 1 day

## 2012-12-25 NOTE — Clinical Social Work Placement (Addendum)
     Clinical Social Work Department CLINICAL SOCIAL WORK PLACEMENT NOTE 01/06/2013  Patient:  Alan Rhodes, Alan Rhodes  Account Number:  [id number] Admit date:  12/24/2012  Clinical Social Worker:  Becky Sax, LCSW  Date/time:  12/25/2012 12:00 M  Clinical Social Work is seeking post-discharge placement for this patient at the following level of care:   SKILLED NURSING   (*CSW will update this form in Epic as items are completed)   12/25/2012  Patient/family provided with [facility] Department of Clinical Social Works list of facilities offering this level of care within the geographic area requested by the patient (or if unable, by the patients family).  12/25/2012  Patient/family informed of their freedom to choose among providers that offer the needed level of care, that participate in Medicare, Medicaid or managed care program needed by the patient, have an available bed and are willing to accept the patient.  12/25/2012  Patient/family informed of [facility] ownership interest in [facility], as well as of the fact that they are under no obligation to receive care at this facility.  PASARR submitted to EDS on 12/25/2012 PASARR number received from [employer] on 01/06/2013  FL2 transmitted to all facilities in geographic area requested by pt/family on  12/25/2012 FL2 transmitted to all facilities within larger geographic area on   Patient informed that his/her managed care company has contracts with or will negotiate with  certain facilities, including the following:     Patient/family informed of bed offers received:  01/06/2013 Patient chooses bed at [facility] Physician recommends and patient chooses bed at  [facility]  Patient to be transferred to [facility] on  01/06/2013 Patient to be transferred to facility by ptar  The following physician request were entered in Epic:   Additional Comments:

## 2012-12-25 NOTE — Evaluation (Addendum)
Physical Therapy Evaluation Patient Details Name: Alan Rhodes MRN: [id number] DOB: 1940/09/13 Today's Date: 12/25/2012 Time: 1040-1100 PT Time Calculation (min): 20 min  PT Assessment / Plan / Recommendation History of Present Illness  72 y.o. male with h/o autism, profound mental retardation, seizures, RA, bipolar, incontinent of bowel and bladder, PTSD admitted from group home with pneumonia. Spoke to caregiver at group home who stated pt was ambulatory at group home without assistance or assistive device prior to admission.   Clinical Impression  *+2 total assist for supine to sit and to pivot to chair, per caregiver pt was ambulatory at group home. Will likely need ST- SNF. He would benefit from acute PT to maximize safety and independence with mobility. **    PT Assessment  Patient needs continued PT services    Follow Up Recommendations  SNF (depending on progress and level of assist available )    Does the patient have the potential to tolerate intense rehabilitation      Barriers to Discharge        Equipment Recommendations  Rolling walker with 5" wheels    Recommendations for Other Services     Frequency Min 3X/week    Precautions / Restrictions Precautions Precautions: Fall Restrictions Weight Bearing Restrictions: No   Pertinent Vitals/Pain *no signs/symptoms of pain**      Mobility  Bed Mobility Bed Mobility: Supine to Sit Supine to Sit: 1: +2 Total assist Supine to Sit: Patient Percentage: 20% Details for Bed Mobility Assistance: assist to advance BLEs and to raise trunk Transfers Transfers: Sit to Stand;Stand to Sit;Stand Pivot Transfers Sit to Stand: 1: +2 Total assist;From chair/3-in-1;From bed Sit to Stand: Patient Percentage: 30% Stand to Sit: 1: +2 Total assist;To chair/3-in-1 Stand to Sit: Patient Percentage: 30% Stand Pivot Transfers: 1: +2 Total assist Stand Pivot Transfers: Patient Percentage: 30% Details for Transfer Assistance:  assist to rise, pt stood with RW for 2 min for pericare 2* incontinence of bowel and required mod assist 2* posterior lean Ambulation/Gait Ambulation/Gait Assistance: Not tested (comment)    Exercises     PT Diagnosis: Difficulty walking;Generalized weakness  PT Problem List: Decreased strength;Decreased activity tolerance;Decreased balance;Decreased mobility;Decreased safety awareness PT Treatment Interventions: Gait training;Functional mobility training;DME instruction;Therapeutic activities;Patient/family education     PT Goals(Current goals can be found in the care plan section) Acute Rehab PT Goals Patient Stated Goal: pt unable PT Goal Formulation: Patient unable to participate in goal setting Time For Goal Achievement: 01/08/13 Potential to Achieve Goals: Fair  Visit Information  Last PT Received On: 12/25/12 Assistance Needed: +2 History of Present Illness: 72 y.o. male with h/o autism, profound mental retardation, seizures, RA, bipolar, incontinent of bowel and bladder, PTSD admitted from group home with pneumonia. Per chart pt was ambulatory at group home PTA.        Prior Functioning  Home Living Family/patient expects to be discharged to:: Group home Prior Function Comments: per ED notes pt was ambulatory at group home  Communication Communication: Expressive difficulties    Cognition  Cognition Arousal/Alertness: Awake/alert Behavior During Therapy: WFL for tasks assessed/performed Overall Cognitive Status: No family/caregiver present to determine baseline cognitive functioning (h/o autism and mental retardation)    Extremity/Trunk Assessment Lower Extremity Assessment Lower Extremity Assessment: Generalized weakness (knee/hip AAROM WNL, ankle DF to 0*, some active movement) Cervical / Trunk Assessment Cervical / Trunk Assessment: Normal   Balance Balance Balance Assessed: Yes Static Standing Balance Static Standing - Balance Support: Bilateral upper  extremity supported Static  Standing - Level of Assistance: 3: Mod assist Static Standing - Comment/# of Minutes: 2 minutes, assist 2* posterior lean  End of Session PT - End of Session Equipment Utilized During Treatment: Gait belt Activity Tolerance: Patient tolerated treatment well Patient left: in chair;with call bell/phone within reach;with chair alarm set Nurse Communication: Mobility status  GP     Ralene Bathe Kistler 12/25/2012, 11:16 AM [phone]

## 2012-12-25 NOTE — Progress Notes (Signed)
Speech Language Pathology Dysphagia Treatment Patient Details Name: Alan Rhodes MRN: [id number] DOB: February 15, 1941 Today's Date: 12/25/2012 Time: 1540-1601 SLP Time Calculation (min): 21 min  Assessment / Plan / Recommendation Clinical Impression  Pt seen for skilled dysphagia session, pt alone in room, SLP helped him to consume approx 4 ounces icecream and 2 ounces ice water.   Delayed swallow noted most notably with icecream - up to 25 seconds without pt triggering a swallow.  Pt given water via straw to facilitate swallow - this triggered a swallow.  Mild amount of coughing with intake noted - could possibly be secondary due to aspiration.  Pt was noted to belch upon SLP entrance to room before intake given.   Rec continue diet with strict aspiration precautions.      Diet Recommendation  Continue with Current Diet: Dysphagia 1 (puree);Thin liquid    SLP Plan   follow up at SNF  Pertinent Vitals/Pain Afebrile, decreased   Swallowing Goals  SLP Swallowing Goals Patient will utilize recommended strategies during swallow to increase swallowing safety with: Total assistance  General Temperature Spikes Noted: No Respiratory Status: Room air Behavior/Cognition: Alert;Cooperative;Doesn't follow directions;Pleasant mood;Confused Oral Cavity - Dentition: Edentulous Patient Positioning: Upright in chair  Oral Cavity - Oral Hygiene   n/a  Dysphagia Treatment Treatment focused on: Skilled observation of diet tolerance Treatment Methods/Modalities: Skilled observation;Differential diagnosis Patient observed directly with PO's: Yes Type of PO's observed: Dysphagia 1 (puree);Thin liquids (icecream) Feeding: Needs assist;Total assist (able to hold spoon with encouragement) Liquids provided via: Straw Pharyngeal Phase Signs & Symptoms: Suspected delayed swallow initiation;Delayed cough Type of cueing:  (pt is not following directions)   GO    [name], MS [facility]  SLP [phone]

## 2012-12-25 NOTE — Clinical Social Work Psychosocial (Signed)
     Clinical Social Work Department BRIEF PSYCHOSOCIAL ASSESSMENT 12/25/2012  Patient:  Alan Rhodes, Alan Rhodes     Account Number:  [id number]     Admit date:  12/24/2012  Clinical Social Worker:  Hattie Perch  Date/Time:  12/25/2012 12:00 M  Referred by:  Physician  Date Referred:  12/25/2012 Referred for  SNF Placement   Other Referral:   Interview type:  Other - See comment Other interview type:   spoke with group home manager.    PSYCHOSOCIAL DATA Living Status:  FACILITY Admitted from facility:   Level of care:  Group Home Primary support name:  cheryl lackey Primary support relationship to patient:   Degree of support available:   poor. patient has guardian through department of social services in [employer].    CURRENT CONCERNS Current Concerns  Post-Acute Placement   Other Concerns:    SOCIAL WORK ASSESSMENT / PLAN CSW met with patient. patient is unable to participate in assessment as patient is profoundly mentally retarded. CSW spoke with kimberly who is in charge of the group home that patient resides in. she states that patient has a guardian through [employer], [employer], [phone]. CSW faxed physical therapy notes to kim. she states that they would be unable to manage patient's care at that level and that he will need to go to short term skilled in order to rehab first. CSW called patient's guardian and left voicemail requesting call back.   Assessment/plan status:   Other assessment/ plan:   Information/referral to community resources:    PATIENTS/FAMILYS RESPONSE TO PLAN OF CARE: group home manager is sad that patient is unable to return but hopeful that after some rehab, he will be able to ambulate again and return home. no contact yet with guardian.

## 2012-12-25 NOTE — Progress Notes (Signed)
Patient referred for Level II Pass[place].  Lorelee Mclaurin C. Ashna Dorough MSW, LCSW [phone]

## 2012-12-26 LAB — LEGIONELLA ANTIGEN, URINE: Legionella Antigen, Urine: NEGATIVE

## 2012-12-26 MED ORDER — DIVALPROEX SODIUM 125 MG PO CPSP
1000.0000 mg | ORAL_CAPSULE | Freq: Every day | ORAL | Status: DC
  Administered 2012-12-26 – 2012-12-27 (×2): 1000 mg via ORAL
  Administered 2012-12-28: 125 mg via ORAL
  Administered 2012-12-29 – 2013-01-05 (×8): 1000 mg via ORAL
  Filled 2012-12-26 (×12): qty 8

## 2012-12-26 MED ORDER — DIVALPROEX SODIUM 125 MG PO CPSP
500.0000 mg | ORAL_CAPSULE | Freq: Every day | ORAL | Status: DC
  Administered 2012-12-27 – 2013-01-06 (×11): 500 mg via ORAL
  Filled 2012-12-26 (×11): qty 4

## 2012-12-26 MED ORDER — LEVOFLOXACIN 750 MG PO TABS: 750.0000 mg | ORAL_TABLET | Freq: Every day | ORAL | Status: DC

## 2012-12-26 MED ORDER — PANTOPRAZOLE SODIUM 20 MG PO TBEC: 20.0000 mg | DELAYED_RELEASE_TABLET | Freq: Every day | ORAL | Status: DC

## 2012-12-26 NOTE — Progress Notes (Signed)
Alan Rhodes November 27, 1940 [id number]  Pt discharged today to group home where he resided prior to this admission. Group home staff arrived this evening to transport pt back to the facility. They were under the understanding that pt was ambulatory without assistance and able to feed himself. Upon their evaluation of patient, they feel they are unequipped to handle his increased level of care at this time. I spoke with the group home director, Selena Batten, who has the same concerns. Discharged has been canceled for now.  Cordelia Pen, NP-C [facility]  pgr [phone]

## 2012-12-26 NOTE — Progress Notes (Addendum)
Patient did much better with physical therapy today. CSW faxed PT eval to group home to see if they will be able to accommodate him.  Dillon Livermore C. Euclid Cassetta MSW, [place] [phone] CSW spoke with kim at group home. They can take patient back. Packet copied and placed in chart. Guardian at [facility] notified. Facility to transport.  Kester Stimpson C. Jamin Humphries MSW, LCSW [phone]

## 2012-12-26 NOTE — Progress Notes (Signed)
Staff member from group home present stating patient not appropriate to d/c to their facility due to walking with a walker.  Spoke with Becky Sax, CSW who states she spoke with the manager of the group home after she received PT's report from today and stated they would be taking the patient back to their facility today.

## 2012-12-26 NOTE — Discharge Summary (Addendum)
Physician Discharge Summary  Alan Rhodes ZOX:[id number] DOB: 1941/05/15 DOA: 12/24/2012  PCP: Galvin Proffer, MD  Admit date: 12/24/2012 Discharge date: 12/26/2012  Time spent: > 35 minutes  Recommendations for Outpatient Follow-up:  1. Please continue to follow up with your primary care physician in 1-2 weeks as indicated below.  Discharge Diagnoses:  Principal Problem:   CAP (community acquired pneumonia) Active Problems:   MR (mental retardation), severe   Seizure disorder   Autism   Rheumatoid arthritis   Discharge Condition: stable  Diet recommendation: Dysphagia I diet low sodium heart healthy  Filed Weights   12/24/12 1536  Weight: 70.1 kg (154 lb 8.7 oz)    History of present illness:  72 y/o that presented to the ED with cough and chest x ray in the ED suspicious for pneumonia.  Hospital Course:  1. CAP - Levaquin for three more days on discharge to complete a 5 day total course - urine legionella antigen pending and strep antigen negative  - sputum culture order placed but we were not able to obtain due to limited patient cooperation with staff given his autism and severe MR - speech therapy evaluation completed and dysphagia 1 diet recommended.  - Also treating for GERD as this may be contributing to clinical picture. Since patient improved with PPI on board will also continue protonix on discharge.  Also Speech therapy evaluation also raised suspicion for GERD - Physical therapy has evaluated patient and is currently recommending SNF   2 . Rheumatoid arthritis  - Will continue on discharge given patient's improvement  3. Seizure d/o  - Will Continue home regimen on discharge  4. Hypothyroidism  - continue home regimen.   5. HPL  - stable continue statin   6. DVT prophylaxis  - heparin  Addendum 7. Mass like lesion in lung - Currently followed by Dr. Sherene Sires as outpatient. Patient to f/u with primary care physician for further evaluation and  recommendations.  Procedures:  None  Consultations:  Physical therapy  Speech therapy  Discharge Exam: Filed Vitals:   12/25/12 0600 12/25/12 1348 12/25/12 2103 12/26/12 0537  BP: 100/59 104/72 127/78 128/72  Pulse: 69 74 72 63  Temp: 98.6 F (37 C) 97.9 F (36.6 C) 97.4 F (36.3 C) 98.4 F (36.9 C)  TempSrc: Oral Oral Oral Oral  Resp: 18 16 18 20   Height:      Weight:      SpO2: 94% 97% 96% 94%    General: Pt in NAD, Alert and awake Cardiovascular: RRR, no MRG Respiratory: CTA BL, no wheezes  Discharge Instructions  Discharge Orders   Future Appointments Provider Department Dept Phone   01/13/2013 10:15 AM Nyoka Cowden, MD [facility] [phone]   Future Orders Complete By Expires     Call MD for:  difficulty breathing, headache or visual disturbances  As directed     Call MD for:  extreme fatigue  As directed     Call MD for:  severe uncontrolled pain  As directed     Call MD for:  temperature >100.4  As directed     Diet - low sodium heart healthy  As directed     Discharge instructions  As directed     Comments:      Please be sure to follow up with your primary care physician in 1-2 weeks or sooner should any new concerns arise.    Increase activity slowly  As directed  Medication List         CALCARB 600/D 600-400 MG-UNIT per tablet  Generic drug:  Calcium Carbonate-Vitamin D  Take 1 tablet by mouth 3 (three) times daily with meals.     DEPAKOTE 500 MG DR tablet  Generic drug:  divalproex  Take 500-1,000 mg by mouth 2 (two) times daily. 500 mg in the morning, 1000 mg at bedtime     docusate sodium 100 MG capsule  Commonly known as:  COLACE  Take 100 mg by mouth 2 (two) times daily.     folic acid 1 MG tablet  Commonly known as:  FOLVITE  Take 1 mg by mouth daily.     levofloxacin 750 MG tablet  Commonly known as:  LEVAQUIN  Take 1 tablet (750 mg total) by mouth daily.     levothyroxine 25 MCG tablet  Commonly known  as:  SYNTHROID, LEVOTHROID  Take 25 mcg by mouth daily before breakfast.     LORazepam 0.5 MG tablet  Commonly known as:  ATIVAN  Take 0.5 mg by mouth 3 (three) times daily as needed for anxiety.     lovastatin 20 MG tablet  Commonly known as:  MEVACOR  Take 20 mg by mouth at bedtime.     methotrexate 2.5 MG tablet  Commonly known as:  RHEUMATREX  Take 12.5 mg by mouth once a week. Caution:Chemotherapy. Protect from light.  5 tabs     MIRALAX powder  Generic drug:  polyethylene glycol powder  Take 17 g by mouth daily.     OXcarbazepine 150 MG tablet  Commonly known as:  TRILEPTAL  Take 300 mg by mouth 2 (two) times daily.     oxybutynin 5 MG tablet  Commonly known as:  DITROPAN  Take 10 mg by mouth 3 (three) times daily.     pantoprazole 20 MG tablet  Commonly known as:  PROTONIX  Take 1 tablet (20 mg total) by mouth daily.     predniSONE 1 MG tablet  Commonly known as:  DELTASONE  Take 1 mg by mouth daily.     risperiDONE 0.5 MG tablet  Commonly known as:  RISPERDAL  Take 0.5-1 mg by mouth 2 (two) times daily. Patient takes 0.5mg  in the morning and 1 mg at bedtime       No Known Allergies    The results of significant diagnostics from this hospitalization (including imaging, microbiology, ancillary and laboratory) are listed below for reference.    Significant Diagnostic Studies: Dg Chest 2 View  12/24/2012   *RADIOLOGY REPORT*  Clinical Data: Chest pain.  CHEST - 2 VIEW  Comparison: Chest CT 12/08/2012.  Findings: Redemonstrated, circumscribed 4 cm mass in the right middle lobe.  Prior CT better characterized central fat and calcification, possibly hamartoma or lipoid pneumonia.  Ill defined opacity at the left base is new from prior.  Small bilateral pleural effusions.  Unchanged prominence of the ascending aorta, which is elongated and accentuated by rightward rotation.  No change in heart size.  Lower thoracic compression fracture, present since at least December 05, 2012.  IMPRESSION:  1.  New ill-defined opacity at the left base, which may represent pneumonia in the appropriate clinical setting.  2.  Right middle lobe mass, reference chest CT 12/08/2012. 3.  Small bilateral pleural effusions.   Original Report Authenticated By: Tiburcio Pea   Ct Head Wo Contrast  11/28/2012   *RADIOLOGY REPORT*  Clinical Data: Larey Seat and hit head.  No reported loss  of consciousness.  CT HEAD WITHOUT CONTRAST  Technique:  Contiguous axial images were obtained from the base of the skull through the vertex without contrast.  Comparison: CT head 09/17/2006.  Findings: No acute stroke or hemorrhage.  No mass lesion or hydrocephalus.  No extra-axial fluid.  Moderate atrophy.  Chronic microvascular ischemic change.  The calvarium is intact.  There is no significant scalp hematoma. No sinus or mastoid fluid is evident.  Compared with priors, there has been  progression of atrophy.  IMPRESSION: Moderate atrophy and chronic microvascular ischemic change.  No skull fracture or intracranial hemorrhage.   Original Report Authenticated By: Davonna Belling, M.D.   Ct Chest Wo Contrast  12/08/2012   *RADIOLOGY REPORT*  Clinical Data: Right lung mass on chest radiograph.  Cough.  CT CHEST WITHOUT CONTRAST  Technique:  Multidetector CT imaging of the chest was performed following the standard protocol without IV contrast.  Comparison: Chest radiograph 09/17/2006.  Findings: Mediastinal lymph nodes are not enlarged by CT size criteria.  Hilar regions are difficult to definitively evaluate without IV contrast.  No axillary adenopathy.  Atherosclerotic calcification of the arterial vasculature, including coronary arteries.  Heart size normal.  No pericardial effusion.  Small hiatal hernia.  Small right pleural effusion. Respiratory motion degrades image quality.  A heterogeneous mass-like lesion in the right middle lobe measures 4.9 x 5.9 cm and contains foci of fat (example series 3, image 33) and  calcification.  There is obstruction of the right middle lobe segmental bronchi. When today's scout view is compared with examination of 09/17/2006, a similar mass-like density was seen at the right lung base on the prior exam.  Fat densities are seen in the lower lobes, right greater than left (image 35).  Peribronchovascular nodularity appears somewhat ill- defined in the superior segment left lower lobe.  Incidental imaging of the upper abdomen shows no acute findings. No worrisome lytic or sclerotic lesions.  Degenerative changes are seen in the spine.  Probable bone island in the left 3rd posterolateral rib.  Lower thoracic compression fractures are age indeterminate.  IMPRESSION:  1. Heterogeneous mass-like lesion in the right middle lobe with foci of fat and calcification, possibly similar when today's scout view is compared with prior radiograph performed on 09/17/2006. Additional areas of fat density in the lower lobes bilaterally. Collectively, findings may be due to lipoid pneumonia.  However, heterogeneous mass-like appearance, location in the right middle lobe and adjacent right pleural effusion warrant consideration of superimposed malignancy. 2.  Lower thoracic compression fractures are indeterminate in age.   Original Report Authenticated By: Leanna Battles, M.D.    Microbiology: Recent Results (from the past 240 hour(s))  CULTURE, BLOOD (ROUTINE X 2)     Status: None   Collection Time    12/24/12 11:43 AM      Result Value Range Status   Specimen Description BLOOD RIGHT FOREARM   Final   Special Requests BOTTLES DRAWN AEROBIC AND ANAEROBIC 5 CC EACH   Final   Culture  Setup Time 12/24/2012 14:15   Final   Culture     Final   Value:        BLOOD CULTURE RECEIVED NO GROWTH TO DATE CULTURE WILL BE HELD FOR 5 DAYS BEFORE ISSUING A FINAL NEGATIVE REPORT   Report Status PENDING   Incomplete  CULTURE, BLOOD (ROUTINE X 2)     Status: None   Collection Time    12/24/12 12:30 PM      Result  Value Range Status   Specimen Description BLOOD LEFT ARM   Final   Special Requests BOTTLES DRAWN AEROBIC AND ANAEROBIC 5 CC EACH   Final   Culture  Setup Time 12/24/2012 14:16   Final   Culture     Final   Value:        BLOOD CULTURE RECEIVED NO GROWTH TO DATE CULTURE WILL BE HELD FOR 5 DAYS BEFORE ISSUING A FINAL NEGATIVE REPORT   Report Status PENDING   Incomplete  URINE CULTURE     Status: None   Collection Time    12/24/12  1:09 PM      Result Value Range Status   Specimen Description URINE, CATHETERIZED   Final   Special Requests NONE   Final   Culture  Setup Time 12/24/2012 18:44   Final   Colony Count NO GROWTH   Final   Culture NO GROWTH   Final   Report Status 12/25/2012 FINAL   Final     Labs: Basic Metabolic Panel:  Recent Labs Lab 12/24/12 1230 12/25/12 0502  NA 134* 134*  K 4.0 4.0  CL 101 103  CO2 26 27  GLUCOSE 115* 85  BUN 14 11  CREATININE 0.88 0.88  CALCIUM 9.2 8.6   Liver Function Tests:  Recent Labs Lab 12/24/12 1230  AST 18  ALT 12  ALKPHOS 81  BILITOT 0.3  PROT 5.9*  ALBUMIN 1.8*   No results found for this basename: LIPASE, AMYLASE,  in the last 168 hours No results found for this basename: AMMONIA,  in the last 168 hours CBC:  Recent Labs Lab 12/24/12 1137 12/25/12 0502 12/25/12 1238  WBC 10.5 8.9 9.4  NEUTROABS 6.7  --   --   HGB 13.9 9.6* 11.4*  HCT 39.5 27.0* 32.7*  MCV 97.1 97.1 97.3  PLT 227 125* 148*   Cardiac Enzymes: No results found for this basename: CKTOTAL, CKMB, CKMBINDEX, TROPONINI,  in the last 168 hours BNP: BNP (last 3 results) No results found for this basename: PROBNP,  in the last 8760 hours CBG: No results found for this basename: GLUCAP,  in the last 168 hours     Signed:  [name]  [facility] 12/26/2012, 12:24 PM

## 2012-12-26 NOTE — Progress Notes (Signed)
HHPT services set up with [facility].  Algernon Huxley RN BSN  [phone]

## 2012-12-26 NOTE — Progress Notes (Signed)
Spoke with Becky Sax CSW who stated Selena Batten of group home stated would be picking patient up after dinner this afternoon

## 2012-12-26 NOTE — Progress Notes (Signed)
CSW spoke with patient's guardian at [employer]. She is agreeable to patient being faxed out and asked for [employer] as she feels it would be better for his friends and care team to visit him there. She is hopeful he will return to his group home eventually.  Joshau Code C. Shamirah Ivan MSW, LCSW [phone]

## 2012-12-26 NOTE — Progress Notes (Signed)
Spoke with Selena Batten from group home and she states staff on way to pick patient up.  We discussed that a manila envelope will be sent with the transporters with discharge summary, fl2, and scripts inside

## 2012-12-26 NOTE — Progress Notes (Signed)
Physical Therapy Treatment Patient Details Name: Alan Rhodes MRN: [id number] DOB: [birth year] Today's Date: 12/26/2012 Time: 4098-1191 PT Time Calculation (min): 31 min  PT Assessment / Plan / Recommendation  PT Comments   Pt admitted for PNA and present with a cough.  Pt required increased time to arrouse.  Sister present and assisted.  Was able to assist pt OOB to amb in hallway + 2 assist for safety, pt did well.  Amb 180 feet. Believe pt to be @ prior level of mobility. If Group Home is able to assist pt at this level, family would prefer pt return there. Will update LPT on D/C plans.    Follow Up Recommendations  Home health PT;Other (comment) (Group Home)     Does the patient have the potential to tolerate intense rehabilitation     Barriers to Discharge        Equipment Recommendations       Recommendations for Other Services    Frequency Min 3X/week   Progress towards PT Goals    Plan      Precautions / Restrictions Precautions Precautions: Fall Restrictions Weight Bearing Restrictions: No    Pertinent Vitals/Pain RA during amb decreased to 84% however quick recover to 92% with rest.    Mobility  Bed Mobility Bed Mobility: Supine to Sit Supine to Sit: 3: Mod assist Details for Bed Mobility Assistance: increased time to process and cueing to stay on task, pt was able to get to EOB however pt does not use B UE's.  Transfers Transfers: Sit to Stand;Stand to [employer] Transfers Sit to Stand: 3: Mod assist;4: Min assist Stand to Sit: To bed Details for Transfer Assistance: assist to rise with repeat, one word VC's and increased time to process command pt was able to rise off bed. Again with minimal use of hands. Ambulation/Gait Ambulation/Gait Assistance: 3: Mod assist Ambulation Distance (Feet): 180 Feet Assistive device: Rolling walker Ambulation/Gait Assistance Details: + 2 for safety pt amb in hallway using RW requiring assist for direction. Pt  present with kyphotic forward flex posture and short step length.  Sister present and assisted by following with chair. Gait Pattern: Step-through pattern;Decreased stride length;Shuffle;Trunk flexed;Narrow base of support Gait velocity: WFL     PT Goals (current goals can now be found in the care plan section)    Visit Information  Last PT Received On: 12/26/12 Assistance Needed: +2 History of Present Illness: 72 y.o. male with h/o autism, profound mental retardation, seizures, RA, bipolar, incontinent of bowel and bladder, PTSD admitted from group home with pneumonia. Per chart pt was ambulatory at group home PTA.     Subjective Data      Cognition       Balance     End of Session PT - End of Session Equipment Utilized During Treatment: Gait belt Activity Tolerance: Patient tolerated treatment well Patient left: in chair;with call bell/phone within reach;with chair alarm set Nurse Communication: Mobility status   Felecia Shelling  PTA [facility]  Acute  Rehab Pager      [phone]

## 2012-12-27 NOTE — Progress Notes (Signed)
[facility] PROGRESS NOTE  [name] JWJ:[id number] DOB: December 14, 1940 DOA: 12/24/2012 PCP: Galvin Proffer, MD  Assessment/Plan:  1. CAP - Levaquin  - urine legionella antigen negative and strep antigen negative - sputum culture has been unable to obtain  - speech therapy evaluation completed and dysphagia 1 diet recommended. - Also treating for GERD as this may be contributing to clinical picture. - Physical therapy has evaluated patient and is currently recommending SNF currently awaiting pasar #  2 . Rheumatoid arthritis  - Will continue to hold methotrexate and prednisone given # 1   3. Seizure d/o  - Will go ahead and continue home regimen   4. Hypothyroidism  - continue home regimen.  - obtain tsh   5. HPL  - stable continue statin   6. DVT prophylaxis  - heparin  Code Status: presumed full Family Communication: no family at bedside Disposition Plan: Will plan on discharging to SNF   Consultants:  Physical therapy  Speech therapy  Procedures:  none  Antibiotics:  Levaquin   HPI/Subjective: No acute issues reported overnight. No acute issues overnight. Patient reportedly was not able to be transitioned home yesterday because ALF evaluation revealed patient was more debilitated than they thought he was.  Currently awaiting placement in to SNF  Objective: Filed Vitals:   12/26/12 1444 12/26/12 2200 12/27/12 0530 12/27/12 1544  BP: 96/62 109/70 100/66 112/75  Pulse: 70 80 58 61  Temp: 97.7 F (36.5 C) 98 F (36.7 C) 97.7 F (36.5 C) 97.8 F (36.6 C)  TempSrc: Oral Axillary Oral Oral  Resp: 18 18 16 16   Height:      Weight:      SpO2: 95% 95% 94% 97%    Intake/Output Summary (Last 24 hours) at 12/27/12 1730 Last data filed at 12/27/12 1156  Gross per 24 hour  Intake    360 ml  Output    926 ml  Net   -566 ml   Filed Weights   12/24/12 1536  Weight: 70.1 kg (154 lb 8.7 oz)    Exam:   General:  Pt in NAD, Alert and  Awake  Cardiovascular: RRR, no MRG  Respiratory: No wheezes, no increased work of breathing.  Abdomen: soft, NT, ND  Musculoskeletal: no cyanosis or clubbing   Data Reviewed: Basic Metabolic Panel:  Recent Labs Lab 12/24/12 1230 12/25/12 0502  NA 134* 134*  K 4.0 4.0  CL 101 103  CO2 26 27  GLUCOSE 115* 85  BUN 14 11  CREATININE 0.88 0.88  CALCIUM 9.2 8.6   Liver Function Tests:  Recent Labs Lab 12/24/12 1230  AST 18  ALT 12  ALKPHOS 81  BILITOT 0.3  PROT 5.9*  ALBUMIN 1.8*   No results found for this basename: LIPASE, AMYLASE,  in the last 168 hours No results found for this basename: AMMONIA,  in the last 168 hours CBC:  Recent Labs Lab 12/24/12 1137 12/25/12 0502 12/25/12 1238  WBC 10.5 8.9 9.4  NEUTROABS 6.7  --   --   HGB 13.9 9.6* 11.4*  HCT 39.5 27.0* 32.7*  MCV 97.1 97.1 97.3  PLT 227 125* 148*   Cardiac Enzymes: No results found for this basename: CKTOTAL, CKMB, CKMBINDEX, TROPONINI,  in the last 168 hours BNP (last 3 results) No results found for this basename: PROBNP,  in the last 8760 hours CBG: No results found for this basename: GLUCAP,  in the last 168 hours  Recent Results (from the past  240 hour(s))  CULTURE, BLOOD (ROUTINE X 2)     Status: None   Collection Time    12/24/12 11:43 AM      Result Value Range Status   Specimen Description BLOOD RIGHT FOREARM   Final   Special Requests BOTTLES DRAWN AEROBIC AND ANAEROBIC 5 CC EACH   Final   Culture  Setup Time 12/24/2012 14:15   Final   Culture     Final   Value:        BLOOD CULTURE RECEIVED NO GROWTH TO DATE CULTURE WILL BE HELD FOR 5 DAYS BEFORE ISSUING A FINAL NEGATIVE REPORT   Report Status PENDING   Incomplete  CULTURE, BLOOD (ROUTINE X 2)     Status: None   Collection Time    12/24/12 12:30 PM      Result Value Range Status   Specimen Description BLOOD LEFT ARM   Final   Special Requests BOTTLES DRAWN AEROBIC AND ANAEROBIC 5 CC EACH   Final   Culture  Setup Time  12/24/2012 14:16   Final   Culture     Final   Value:        BLOOD CULTURE RECEIVED NO GROWTH TO DATE CULTURE WILL BE HELD FOR 5 DAYS BEFORE ISSUING A FINAL NEGATIVE REPORT   Report Status PENDING   Incomplete  URINE CULTURE     Status: None   Collection Time    12/24/12  1:09 PM      Result Value Range Status   Specimen Description URINE, CATHETERIZED   Final   Special Requests NONE   Final   Culture  Setup Time 12/24/2012 18:44   Final   Colony Count NO GROWTH   Final   Culture NO GROWTH   Final   Report Status 12/25/2012 FINAL   Final     Studies: No results found.  Scheduled Meds: . sodium chloride   Intravenous Once  . divalproex  1,000 mg Oral QHS  . divalproex  500 mg Oral Daily  . docusate sodium  100 mg Oral BID  . feeding supplement  237 mL Oral TID BM  . folic acid  1 mg Oral Daily  . heparin  5,000 Units Subcutaneous Q8H  . levofloxacin (LEVAQUIN) IV  750 mg Intravenous Q24H  . levothyroxine  25 mcg Oral QAC breakfast  . OXcarbazepine  300 mg Oral BID  . oxybutynin  10 mg Oral TID  . pantoprazole (PROTONIX) IV  40 mg Intravenous Daily  . polyethylene glycol  17 g Oral Daily  . risperiDONE  0.5 mg Oral Daily   And  . risperiDONE  1 mg Oral QHS  . simvastatin  5 mg Oral q1800   Continuous Infusions: . dextrose 5 % and 0.45 % NaCl with KCl 20 mEq/L 1,000 mL (12/25/12 0550)    Principal Problem:   CAP (community acquired pneumonia) Active Problems:   MR (mental retardation), severe   Seizure disorder   Autism   Rheumatoid arthritis    Time spent: > 35 minutes    Alan Rhodes  [facility] Pager [phone]. If 7PM-7AM, please contact night-coverage at [email or web address], password [facility] 12/27/2012, 5:30 PM  LOS: 3 days

## 2012-12-27 NOTE — Progress Notes (Signed)
Informed by RN that Pt is ready for d/c.  Noted that Pt was referred for a Level II [facility] on July 10th.  No number, as of today.  Pt cannot go to a SNF without a Pasaar number.  [facility] does not do Level II reviews on the weekend.  Notified Pt's guardian, RN and MD.  Mosie Epstein CSW to follow.  Providence Crosby, LCSWA Clinical Social Work [phone]

## 2012-12-27 NOTE — Progress Notes (Signed)
Patient's record has been reviewed by social work in reference to placement. The patient will need to be assessed by state first in order to be considered for possible SNF placement.

## 2012-12-28 MED ORDER — LEVOFLOXACIN 750 MG PO TABS
750.0000 mg | ORAL_TABLET | ORAL | Status: DC
  Administered 2012-12-28 – 2013-01-02 (×6): 750 mg via ORAL
  Filled 2012-12-28 (×7): qty 1

## 2012-12-28 NOTE — Progress Notes (Signed)
[facility] PROGRESS NOTE  Alan Rhodes WUJ:[id number] DOB: 28-Oct-1940 DOA: 12/24/2012 PCP: Galvin Proffer, MD  Assessment/Plan:  1. CAP - Levaquin  - urine legionella antigen negative and strep antigen negative - sputum culture has been unable to obtain  - speech therapy evaluation completed and dysphagia 1 diet recommended. - Also treating for GERD as this may be contributing to clinical picture. - Physical therapy has evaluated patient and is currently recommending SNF currently awaiting pasar # - Cleared medically for transition to SNF once able.  2 . Rheumatoid arthritis  - Will continue to hold methotrexate and prednisone given # 1   3. Seizure d/o  - Will go ahead and continue home regimen   4. Hypothyroidism  - continue home regimen.  - obtain tsh   5. HPL  - stable continue statin   6. DVT prophylaxis  - heparin  Code Status: presumed full Family Communication: no family at bedside Disposition Plan: Will plan on discharging to SNF   Consultants:  Physical therapy  Speech therapy  Procedures:  none  Antibiotics:  Levaquin   HPI/Subjective: No acute issues overnight reported to me. Pt at baseline does not interact with people per discussion with caregiver  Objective: Filed Vitals:   12/27/12 0530 12/27/12 1544 12/27/12 2109 12/28/12 0500  BP: 100/66 112/75 106/65 118/70  Pulse: 58 61 53 62  Temp: 97.7 F (36.5 C) 97.8 F (36.6 C) 97.9 F (36.6 C) 98.4 F (36.9 C)  TempSrc: Oral Oral Oral Oral  Resp: 16 16 18 18   Height:      Weight:      SpO2: 94% 97% 94% 97%    Intake/Output Summary (Last 24 hours) at 12/28/12 1235 Last data filed at 12/28/12 0900  Gross per 24 hour  Intake    240 ml  Output    800 ml  Net   -560 ml   Filed Weights   12/24/12 1536  Weight: 70.1 kg (154 lb 8.7 oz)    Exam:   General:  Pt in NAD, Alert and Awake  Cardiovascular: RRR, no MRG  Respiratory: No wheezes, no increased work of  breathing.  Abdomen: soft, NT, ND  Musculoskeletal: no cyanosis or clubbing   Data Reviewed: Basic Metabolic Panel:  Recent Labs Lab 12/24/12 1230 12/25/12 0502  NA 134* 134*  K 4.0 4.0  CL 101 103  CO2 26 27  GLUCOSE 115* 85  BUN 14 11  CREATININE 0.88 0.88  CALCIUM 9.2 8.6   Liver Function Tests:  Recent Labs Lab 12/24/12 1230  AST 18  ALT 12  ALKPHOS 81  BILITOT 0.3  PROT 5.9*  ALBUMIN 1.8*   No results found for this basename: LIPASE, AMYLASE,  in the last 168 hours No results found for this basename: AMMONIA,  in the last 168 hours CBC:  Recent Labs Lab 12/24/12 1137 12/25/12 0502 12/25/12 1238  WBC 10.5 8.9 9.4  NEUTROABS 6.7  --   --   HGB 13.9 9.6* 11.4*  HCT 39.5 27.0* 32.7*  MCV 97.1 97.1 97.3  PLT 227 125* 148*   Cardiac Enzymes: No results found for this basename: CKTOTAL, CKMB, CKMBINDEX, TROPONINI,  in the last 168 hours BNP (last 3 results) No results found for this basename: PROBNP,  in the last 8760 hours CBG: No results found for this basename: GLUCAP,  in the last 168 hours  Recent Results (from the past 240 hour(s))  CULTURE, BLOOD (ROUTINE X 2)  Status: None   Collection Time    12/24/12 11:43 AM      Result Value Range Status   Specimen Description BLOOD RIGHT FOREARM   Final   Special Requests BOTTLES DRAWN AEROBIC AND ANAEROBIC 5 CC EACH   Final   Culture  Setup Time 12/24/2012 14:15   Final   Culture     Final   Value:        BLOOD CULTURE RECEIVED NO GROWTH TO DATE CULTURE WILL BE HELD FOR 5 DAYS BEFORE ISSUING A FINAL NEGATIVE REPORT   Report Status PENDING   Incomplete  CULTURE, BLOOD (ROUTINE X 2)     Status: None   Collection Time    12/24/12 12:30 PM      Result Value Range Status   Specimen Description BLOOD LEFT ARM   Final   Special Requests BOTTLES DRAWN AEROBIC AND ANAEROBIC 5 CC EACH   Final   Culture  Setup Time 12/24/2012 14:16   Final   Culture     Final   Value:        BLOOD CULTURE RECEIVED NO  GROWTH TO DATE CULTURE WILL BE HELD FOR 5 DAYS BEFORE ISSUING A FINAL NEGATIVE REPORT   Report Status PENDING   Incomplete  URINE CULTURE     Status: None   Collection Time    12/24/12  1:09 PM      Result Value Range Status   Specimen Description URINE, CATHETERIZED   Final   Special Requests NONE   Final   Culture  Setup Time 12/24/2012 18:44   Final   Colony Count NO GROWTH   Final   Culture NO GROWTH   Final   Report Status 12/25/2012 FINAL   Final     Studies: No results found.  Scheduled Meds: . sodium chloride   Intravenous Once  . divalproex  1,000 mg Oral QHS  . divalproex  500 mg Oral Daily  . docusate sodium  100 mg Oral BID  . feeding supplement  237 mL Oral TID BM  . folic acid  1 mg Oral Daily  . heparin  5,000 Units Subcutaneous Q8H  . levofloxacin (LEVAQUIN) IV  750 mg Intravenous Q24H  . levothyroxine  25 mcg Oral QAC breakfast  . OXcarbazepine  300 mg Oral BID  . oxybutynin  10 mg Oral TID  . pantoprazole (PROTONIX) IV  40 mg Intravenous Daily  . polyethylene glycol  17 g Oral Daily  . risperiDONE  0.5 mg Oral Daily   And  . risperiDONE  1 mg Oral QHS  . simvastatin  5 mg Oral q1800   Continuous Infusions: . 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 If 7PM-7AM, please contact night-coverage at [email or web address], password [facility] 12/28/2012, 12:35 PM  LOS: 4 days

## 2012-12-28 NOTE — Progress Notes (Signed)
ANTIBIOTIC CONSULT NOTE - FOLLOW UP  Pharmacy Consult for Levaquin Indication: CAP  No Known Allergies  Patient Measurements: Height: 5\' 3"  (160 cm) Weight: 154 lb 8.7 oz (70.1 kg) IBW/kg (Calculated) : 56.9  Vital Signs: Temp: 98.4 F (36.9 C) (07/13 0500) Temp src: Oral (07/13 0500) BP: 118/70 mmHg (07/13 0500) Pulse Rate: 62 (07/13 0500)    Assessment:  72 yom on D#5 of Levaquin for CAP treatment. Unable to obtain sputum culture. Blood culture with NGTD, urine cx negative, strep pneumo and legionella urine ag negative. Patient is afebrile, WBC wnl, Scr stable/wnl for CG CrCl 68 ml/min. Discharge orders written for Levaquin 750 mg po q24h.   Plan:   MD, given Levaquin at high doses of 750 mg IV q24h - ok to complete a 5 day course for CAP treatment.  Geoffry Paradise, PharmD, BCPS Pager: [phone] 9:39 AM Pharmacy #: [phone]

## 2012-12-29 NOTE — Progress Notes (Signed)
CSW spoke with Elnita Maxwell, patient's guardian. Discussed snf placement. She is agreeable to [facility] but states that she spoke with the group home who stated that when they went to pick patient up on Friday, the nurse taking care of the patient stated that the patient wasn't ready for discharge. CSW states that this is not the case and read cheryl the note from the nurse practitioner cancelling the discharge due to group homes refusal to accept patient back. Elnita Maxwell states that she will call CSW back.  Deena Shaub C. Broadus Costilla MSW, LCSW [phone]

## 2012-12-29 NOTE — Progress Notes (Signed)
[facility] PROGRESS NOTE  Alan Rhodes:[id number] DOB: [birth year] DOA: 12/24/2012 PCP: Galvin Proffer, MD  Assessment/Plan:  1. CAP - Levaquin  - urine legionella antigen negative and strep antigen negative - sputum culture has been unable to obtain  - speech therapy evaluation completed and dysphagia 1 diet recommended. - Also treating for GERD as this may be contributing to clinical picture. - Physical therapy has evaluated patient and is currently recommending SNF. Currently awaiting pasaar number per social worker reports.  Social worker is currently involved and will contact me once pasaar number is available.  Discharge summary complete as well as med rec.   - Cleared medically for transition to SNF once able.  2 . Rheumatoid arthritis  - Will continue home regimen once discharged  3. Seizure d/o  - Will go ahead and continue home regimen   4. Hypothyroidism  - continue home regimen.  - obtain tsh   5. HPL  - stable continue statin   6. DVT prophylaxis  - heparin while in house.  Code Status: presumed full Family Communication: no family at bedside Disposition Plan: Will plan on discharging to SNF once able. Social worker currently working on disposition.   Consultants:  Physical therapy  Speech therapy  Procedures:  none  Antibiotics:  Levaquin   HPI/Subjective: No acute issues overnight reported to me. Pt at baseline does not interact with people per discussion with caregiver.  Objective: Filed Vitals:   12/28/12 0500 12/28/12 1454 12/28/12 2235 12/29/12 0552  BP: 118/70 97/54 99/62  111/65  Pulse: 62 103 71 67  Temp: 98.4 F (36.9 C) 97.4 F (36.3 C) 98.3 F (36.8 C) 98.8 F (37.1 C)  TempSrc: Oral Oral Oral Oral  Resp: 18 18 18 18   Height:      Weight:      SpO2: 97% 89% 95% 96%    Intake/Output Summary (Last 24 hours) at 12/29/12 1141 Last data filed at 12/29/12 0859  Gross per 24 hour  Intake    340 ml  Output    400  ml  Net    -60 ml   Filed Weights   12/24/12 1536  Weight: 70.1 kg (154 lb 8.7 oz)    Exam:   General:  Pt in NAD, Alert and Awake  Cardiovascular: RRR, no MRG  Respiratory: No wheezes, no increased work of breathing.  Abdomen: soft, NT, ND  Musculoskeletal: no cyanosis or clubbing   Data Reviewed: Basic Metabolic Panel:  Recent Labs Lab 12/24/12 1230 12/25/12 0502  NA 134* 134*  K 4.0 4.0  CL 101 103  CO2 26 27  GLUCOSE 115* 85  BUN 14 11  CREATININE 0.88 0.88  CALCIUM 9.2 8.6   Liver Function Tests:  Recent Labs Lab 12/24/12 1230  AST 18  ALT 12  ALKPHOS 81  BILITOT 0.3  PROT 5.9*  ALBUMIN 1.8*   No results found for this basename: LIPASE, AMYLASE,  in the last 168 hours No results found for this basename: AMMONIA,  in the last 168 hours CBC:  Recent Labs Lab 12/24/12 1137 12/25/12 0502 12/25/12 1238  WBC 10.5 8.9 9.4  NEUTROABS 6.7  --   --   HGB 13.9 9.6* 11.4*  HCT 39.5 27.0* 32.7*  MCV 97.1 97.1 97.3  PLT 227 125* 148*   Cardiac Enzymes: No results found for this basename: CKTOTAL, CKMB, CKMBINDEX, TROPONINI,  in the last 168 hours BNP (last 3 results) No results found for this basename: PROBNP,  in the last 8760 hours CBG: No results found for this basename: GLUCAP,  in the last 168 hours  Recent Results (from the past 240 hour(s))  CULTURE, BLOOD (ROUTINE X 2)     Status: None   Collection Time    12/24/12 11:43 AM      Result Value Range Status   Specimen Description BLOOD RIGHT FOREARM   Final   Special Requests BOTTLES DRAWN AEROBIC AND ANAEROBIC 5 CC EACH   Final   Culture  Setup Time 12/24/2012 14:15   Final   Culture     Final   Value:        BLOOD CULTURE RECEIVED NO GROWTH TO DATE CULTURE WILL BE HELD FOR 5 DAYS BEFORE ISSUING A FINAL NEGATIVE REPORT   Report Status PENDING   Incomplete  CULTURE, BLOOD (ROUTINE X 2)     Status: None   Collection Time    12/24/12 12:30 PM      Result Value Range Status   Specimen  Description BLOOD LEFT ARM   Final   Special Requests BOTTLES DRAWN AEROBIC AND ANAEROBIC 5 CC EACH   Final   Culture  Setup Time 12/24/2012 14:16   Final   Culture     Final   Value:        BLOOD CULTURE RECEIVED NO GROWTH TO DATE CULTURE WILL BE HELD FOR 5 DAYS BEFORE ISSUING A FINAL NEGATIVE REPORT   Report Status PENDING   Incomplete  URINE CULTURE     Status: None   Collection Time    12/24/12  1:09 PM      Result Value Range Status   Specimen Description URINE, CATHETERIZED   Final   Special Requests NONE   Final   Culture  Setup Time 12/24/2012 18:44   Final   Colony Count NO GROWTH   Final   Culture NO GROWTH   Final   Report Status 12/25/2012 FINAL   Final     Studies: No results found.  Scheduled Meds: . sodium chloride   Intravenous Once  . divalproex  1,000 mg Oral QHS  . divalproex  500 mg Oral Daily  . docusate sodium  100 mg Oral BID  . feeding supplement  237 mL Oral TID BM  . folic acid  1 mg Oral Daily  . heparin  5,000 Units Subcutaneous Q8H  . levofloxacin  750 mg Oral Q24H  . levothyroxine  25 mcg Oral QAC breakfast  . OXcarbazepine  300 mg Oral BID  . oxybutynin  10 mg Oral TID  . pantoprazole (PROTONIX) IV  40 mg Intravenous Daily  . polyethylene glycol  17 g Oral Daily  . risperiDONE  0.5 mg Oral Daily   And  . risperiDONE  1 mg Oral QHS  . simvastatin  5 mg Oral q1800   Continuous Infusions: . dextrose 5 % and 0.45 % NaCl with KCl 20 mEq/L 1,000 mL (12/25/12 0550)    Principal Problem:   CAP (community acquired pneumonia) Active Problems:   MR (mental retardation), severe   Seizure disorder   Autism   Rheumatoid arthritis    Time spent: > 35 minutes    Alan Rhodes  [facility] Pager [phone]. If 7PM-7AM, please contact night-coverage at [email or web address], password [facility] 12/29/2012, 11:41 AM  LOS: 5 days

## 2012-12-29 NOTE — Progress Notes (Signed)
Speech Language Pathology Dysphagia Treatment Patient Details Name: Alan Rhodes MRN: [id number] DOB: May 20, 1941 Today's Date: 12/29/2012 Time: 1191-4782 SLP Time Calculation (min): 14 min  Assessment / Plan / Recommendation Clinical Impression  Pt sseen for skilled dysphagia treatment, pt sitting in chair, partially consumed lunch tray at bedside.  SLP helped pt to eat few bites of icecream, consume few sips of water/milk - delayed swallow continued more pronounced with liquids than solids today with ongoing delayed cough.  SLP attempted to administer cracker *softened with icecream* without success, pt did not open mouth to accept bolus.    Alan Rhodes *caregiver at group home* arrived to visit with pt and reported worsening problems swallowing (increased coughing) after pt started to become ill severeal weeks prior to admit.  Alan Rhodes modified pt diet to pureed/thin from soft.  Pt was also self feeding up until approx 1 - 1 1/2 weeks prior to admission when he became ill.    Suspect pt's dysphagia is cognitive based but weakness may contribute to issues.  If MD desires, given pt with progressive problems swallowing prior to admission, MBS may be completed to allow instrumental evaluation completion.  Otherwise recommend follow up at facility with strict aspiration precautions to mitigate aspiration risk.   Pt is not able to follow directions therefore compensation strategies are diet modifiers or strategies that do not require cognizant effort on part of pt.      Diet Recommendation  Continue with Current Diet: Dysphagia 1 (puree);Thin liquid    SLP Plan Continue with current plan of care   Pertinent Vitals/Pain Afebrile, decreased   Swallowing Goals  SLP Swallowing Goals Swallow Study Goal #2 - Progress: Progressing toward goal  General Temperature Spikes Noted: No Respiratory Status: Room air Behavior/Cognition: Alert;Doesn't follow directions;Pleasant mood Oral Cavity - Dentition:  Edentulous Patient Positioning: Partially reclined  Oral Cavity - Oral Hygiene Does patient have any of the following "at risk" factors?: Saliva - thick, dry mouth   Dysphagia Treatment Treatment focused on: Skilled observation of diet tolerance Treatment Methods/Modalities: Skilled observation;Differential diagnosis Patient observed directly with PO's: Yes Type of PO's observed: Dysphagia 1 (puree);Thin liquids;Regular Feeding: Total assist Liquids provided via: Straw Pharyngeal Phase Signs & Symptoms: Suspected delayed swallow initiation;Delayed cough Type of cueing:  (pt does not follow commands)   GO    Alan Burnet, MS [facility] SLP [phone]

## 2012-12-29 NOTE — Progress Notes (Signed)
Physical Therapy Treatment Patient Details Name: ATWOOD ADCOCK MRN: [id number] DOB: 05-Aug-1940 Today's Date: 12/29/2012 Time: 4098-1191 PT Time Calculation (min): 23 min  PT Assessment / Plan / Recommendation  PT Comments   Pt has a Hx Autism and resides at a group home admitted for PNA.  Pt progressing well with his mobility.  Assisted OOB to amb to BR then amb in hallway.    Follow Up Recommendations  SNF([facility] unable to provide care) Will update LPT on change in D/C plans     Does the patient have the potential to tolerate intense rehabilitation     Barriers to Discharge        Equipment Recommendations       Recommendations for Other Services    Frequency Min 3X/week   Progress towards PT Goals Progress towards PT goals: Progressing toward goals  Plan      Precautions / Restrictions Precautions Precautions: Fall Restrictions Weight Bearing Restrictions: No    Pertinent Vitals/Pain No c/o pain    Mobility  Bed Mobility Bed Mobility: Supine to Sit Supine to Sit: 4: Min assist Details for Bed Mobility Assistance: Pt more able to self assist and sit on EOB with Min assist vs Mod assist last visit Transfers Transfers: Sit to Stand;Stand to Sit;Stand Pivot Transfers Sit to Stand: 4: Min assist Stand to Sit: To bed Details for Transfer Assistance: Pt demon increased ability to self rise and appeared more alert today.  Ambulation/Gait Ambulation/Gait Assistance: 4: Min assist;3: Mod assist Ambulation Distance (Feet): 205 Feet Assistive device: Rolling walker Ambulation/Gait Assistance Details: + 2 for safety however pt demon increased balance and staedyness requiring Min/MinGuard assist this session.  Pt amb greater distance. Used a RW to increase stability however pt cognitively required assistance for propeer use.  Gait Pattern: Step-through pattern;Decreased stride length;Shuffle;Trunk flexed;Narrow base of support Gait velocity: WFL     PT Goals (current goals  can now be found in the care plan section)    Visit Information  Last PT Received On: 12/29/12 Assistance Needed: +2 History of Present Illness: 72 y.o. male with h/o autism, profound mental retardation, seizures, RA, bipolar, incontinent of bowel and bladder, PTSD admitted from group home with pneumonia. Per chart pt was ambulatory at group home PTA.     Subjective Data      Cognition       Balance     End of Session PT - End of Session Equipment Utilized During Treatment: Gait belt Activity Tolerance: Patient tolerated treatment well Patient left: in chair;with call bell/phone within reach;with chair alarm set   Felecia Shelling  PTA [facility]  Acute  Rehab Pager      [phone]

## 2012-12-30 LAB — CULTURE, BLOOD (ROUTINE X 2): Culture: NO GROWTH

## 2012-12-30 NOTE — Progress Notes (Addendum)
[facility] PROGRESS NOTE  Alan Rhodes ZOX:[id number] DOB: [birth year] DOA: 12/24/2012 PCP: Galvin Proffer, MD  Assessment/Plan:  1. CAP - Levaquin  - urine legionella antigen negative and strep antigen negative - sputum culture has been unable to obtain  - speech therapy evaluation completed and dysphagia 1 diet recommended. - Also treating for GERD as this may be contributing to clinical picture. - Physical therapy has evaluated patient and is currently recommending SNF. Currently awaiting pasaar number per social worker reports.  Social worker is currently involved and will contact me once pasaar number is available.  Discharge summary complete as well as med rec.   - Cleared medically for transition to SNF once able.  2 . Rheumatoid arthritis  - Will continue home regimen once discharged  3. Seizure d/o  - Will go ahead and continue home regimen   4. Hypothyroidism  - continue home regimen.  - obtain tsh   5. HPL  - stable continue statin   6. DVT prophylaxis  - heparin while in house.  Addendum 7. Mass at lung - Dr. 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 He is a ward of the state and informed consent is not feasible. - Will have patient f/u with Dr. Sherene Sires for further evaluation and recommendations as outpatient.  Code Status: presumed full Family Communication: no family at bedside Disposition Plan: Will plan on discharging to SNF once able. Social worker currently working on disposition.   Consultants:  Physical therapy  Speech  therapy  Procedures:  none  Antibiotics:  Levaquin   HPI/Subjective: No acute issues overnight reported to me. Pt at baseline does not interact with people per discussion with caregiver.  Objective: Filed Vitals:   12/29/12 1428 12/29/12 2057 12/30/12 0604 12/30/12 1400  BP: 101/59 100/64 102/46 112/53  Pulse: 67 66 95 83  Temp: 97.5 F (36.4 C) 98.4 F (36.9 C) 98.7 F (37.1 C) 98.3 F (36.8 C)  TempSrc: Oral Oral Oral Oral  Resp:  20 18 18   Height:      Weight:      SpO2: 96% 96% 97% 98%    Intake/Output Summary (Last 24 hours) at 12/30/12 1614 Last data filed at 12/30/12 1400  Gross per 24 hour  Intake    120 ml  Output   1600 ml  Net  -1480 ml   Filed Weights   12/24/12 1536  Weight: 70.1 kg (154 lb 8.7 oz)    Exam:   General:  Pt in NAD, Alert and Awake  Cardiovascular: RRR, no MRG  Respiratory: No wheezes, no increased work of breathing.  Abdomen: soft, NT, ND  Musculoskeletal: no cyanosis or clubbing   Data Reviewed: Basic Metabolic Panel:  Recent Labs Lab 12/24/12 1230 12/25/12 0502  NA 134* 134*  K 4.0 4.0  CL 101 103  CO2 26 27  GLUCOSE 115* 85  BUN 14 11  CREATININE 0.88 0.88  CALCIUM 9.2 8.6   Liver Function Tests:  Recent Labs Lab 12/24/12 1230  AST 18  ALT 12  ALKPHOS 81  BILITOT 0.3  PROT  5.9*  ALBUMIN 1.8*   No results found for this basename: LIPASE, AMYLASE,  in the last 168 hours No results found for this basename: AMMONIA,  in the last 168 hours CBC:  Recent Labs Lab 12/24/12 1137 12/25/12 0502 12/25/12 1238  WBC 10.5 8.9 9.4  NEUTROABS 6.7  --   --   HGB 13.9 9.6* 11.4*  HCT 39.5 27.0* 32.7*  MCV 97.1 97.1 97.3  PLT 227 125* 148*   Cardiac Enzymes: No results found for this basename: CKTOTAL, CKMB, CKMBINDEX, TROPONINI,  in the last 168 hours BNP (last 3 results) No results found for this basename: PROBNP,  in the last 8760 hours CBG: No results found for this basename: GLUCAP,  in the last  168 hours  Recent Results (from the past 240 hour(s))  CULTURE, BLOOD (ROUTINE X 2)     Status: None   Collection Time    12/24/12 11:43 AM      Result Value Range Status   Specimen Description BLOOD RIGHT FOREARM   Final   Special Requests BOTTLES DRAWN AEROBIC AND ANAEROBIC 5 CC EACH   Final   Culture  Setup Time 12/24/2012 14:15   Final   Culture NO GROWTH 5 DAYS   Final   Report Status 12/30/2012 FINAL   Final  CULTURE, BLOOD (ROUTINE X 2)     Status: None   Collection Time    12/24/12 12:30 PM      Result Value Range Status   Specimen Description BLOOD LEFT ARM   Final   Special Requests BOTTLES DRAWN AEROBIC AND ANAEROBIC 5 CC EACH   Final   Culture  Setup Time 12/24/2012 14:16   Final   Culture NO GROWTH 5 DAYS   Final   Report Status 12/30/2012 FINAL   Final  URINE CULTURE     Status: None   Collection Time    12/24/12  1:09 PM      Result Value Range Status   Specimen Description URINE, CATHETERIZED   Final   Special Requests NONE   Final   Culture  Setup Time 12/24/2012 18:44   Final   Colony Count NO GROWTH   Final   Culture NO GROWTH   Final   Report Status 12/25/2012 FINAL   Final     Studies: No results found.  Scheduled Meds: . sodium chloride   Intravenous Once  . divalproex  1,000 mg Oral QHS  . divalproex  500 mg Oral Daily  . docusate sodium  100 mg Oral BID  . feeding supplement  237 mL Oral TID BM  . folic acid  1 mg Oral Daily  . heparin  5,000 Units Subcutaneous Q8H  . levofloxacin  750 mg Oral Q24H  . levothyroxine  25 mcg Oral QAC breakfast  . OXcarbazepine  300 mg Oral BID  . oxybutynin  10 mg Oral TID  . pantoprazole (PROTONIX) IV  40 mg Intravenous Daily  . polyethylene glycol  17 g Oral Daily  . risperiDONE  0.5 mg Oral Daily   And  . risperiDONE  1 mg Oral QHS  . simvastatin  5 mg Oral q1800   Continuous Infusions: . dextrose 5 % and 0.45 % NaCl with KCl 20 mEq/L 1,000 mL (12/25/12 0550)    Principal Problem:   CAP (community  acquired pneumonia) Active Problems:   MR (mental retardation), severe   Seizure disorder   Autism   Rheumatoid arthritis    Time spent: > 35  minutes    [name]  [facility] Pager [phone]. If 7PM-7AM, please contact night-coverage at [email or web address], password [facility] 12/30/2012, 4:14 PM  LOS: 6 days

## 2012-12-31 DIAGNOSIS — J690 Pneumonitis due to inhalation of food and vomit: Principal | ICD-10-CM

## 2012-12-31 DIAGNOSIS — R1310 Dysphagia, unspecified: Secondary | ICD-10-CM

## 2012-12-31 DIAGNOSIS — R222 Localized swelling, mass and lump, trunk: Secondary | ICD-10-CM

## 2012-12-31 MED ORDER — ENSURE PUDDING PO PUDG
1.0000 | Freq: Three times a day (TID) | ORAL | Status: DC
  Administered 2013-01-01 – 2013-01-05 (×11): 1 via ORAL
  Filled 2012-12-31 (×19): qty 1

## 2012-12-31 NOTE — Progress Notes (Signed)
NUTRITION FOLLOW UP  Intervention:   - Ensure pudding TID - Continue Ensure Complete TID - encouraged nurse tech to offer with meals - Will continue to monitor   Nutrition Dx:   Inadequate oral intake related to medical condition as evidenced by pt eating 0-50% of meals - ongoing   Goal:   Pt to meet >/= 90% of their estimated nutrition needs - not met   Monitor:   Weights, labs, intake  Assessment:   Pt asleep during visit. Nurse tech who has been feeding pt reports only thing pt will eat is mashed potatoes, and only had a few bites today. She reports pt has not been getting Ensures. PO intake the past few days has been between 25-50%. No new weights since admission.   Height: Ht Readings from Last 1 Encounters:  12/24/12 5\' 3"  (1.6 m)    Weight Status:   Wt Readings from Last 1 Encounters:  12/24/12 154 lb 8.7 oz (70.1 kg)    Re-estimated needs:  Kcal: 1620-1890 Protein: 70-84g Fluid: 1.6-1.8L/day  Skin: +1 RLE, LLE edema  Diet Order: Dysphagia 1   Intake/Output Summary (Last 24 hours) at 12/31/12 1603 Last data filed at 12/30/12 1736  Gross per 24 hour  Intake    240 ml  Output    200 ml  Net     40 ml    Last BM: 7/14   Labs:   Recent Labs Lab 12/25/12 0502  NA 134*  K 4.0  CL 103  CO2 27  BUN 11  CREATININE 0.88  CALCIUM 8.6  GLUCOSE 85    CBG (last 3)  No results found for this basename: GLUCAP,  in the last 72 hours  Scheduled Meds: . sodium chloride   Intravenous Once  . divalproex  1,000 mg Oral QHS  . divalproex  500 mg Oral Daily  . docusate sodium  100 mg Oral BID  . feeding supplement  237 mL Oral TID BM  . folic acid  1 mg Oral Daily  . heparin  5,000 Units Subcutaneous Q8H  . levofloxacin  750 mg Oral Q24H  . levothyroxine  25 mcg Oral QAC breakfast  . OXcarbazepine  300 mg Oral BID  . oxybutynin  10 mg Oral TID  . pantoprazole (PROTONIX) IV  40 mg Intravenous Daily  . polyethylene glycol  17 g Oral Daily  .  risperiDONE  0.5 mg Oral Daily   And  . risperiDONE  1 mg Oral QHS  . simvastatin  5 mg Oral q1800    Continuous Infusions: . dextrose 5 % and 0.45 % NaCl with KCl 20 mEq/L 1,000 mL (12/25/12 0550)     Levon Hedger MS, RD, LDN [phone] Pager [phone] After Hours Pager

## 2012-12-31 NOTE — Progress Notes (Signed)
Speech Language Pathology Dysphagia Treatment Patient Details Name: Alan Rhodes MRN: [id number] DOB: [birth year] Today's Date: 12/31/2012 Time: 1240-1300 SLP Time Calculation (min): 20 min  Assessment / Plan / Recommendation Clinical Impression  Pt seen for skilled dysphagia treatment to determine tolerance of po intake and if MD desires MBS - no order placed for MBS at this time.  RN reports pt coughing with intake consisently.  Pt sitting with lunch tray trying to feed himself.  Observed pt with pudding, mashed potatoes and gingerale.  Pt is not triggering a swallow with any pureed/solid consistencies which is likely main source of aspiration.  He does however trigger with liquids.  Compensation to mitigate aspiration is to provide small bite of pureed food followed by sip of liquid to aid oral transit and swallow.    Suspect pt will have ongoing aspiration and dysphagia may progress to where it is difficult to maintain nutrition.  Note pt for potential dc today to SNF, recommend follow up with SLP at next venue to continue to mitigate aspiration risk.    SLP to sign off at this time, please order MBS if desire instrumental evaluation.      Diet Recommendation  Continue with Current Diet: Dysphagia 1 (puree);Thin liquid (with known risks)    SLP Plan All goals met   Pertinent Vitals/Pain Afebrile, decreased    Swallowing Goals  SLP Swallowing Goals Swallow Study Goal #2 - Progress: Met  General Temperature Spikes Noted: No Respiratory Status: Room air Behavior/Cognition: Alert;Doesn't follow directions;Pleasant mood Oral Cavity - Dentition: Edentulous Patient Positioning: Partially reclined  Oral Cavity - Oral Hygiene Does patient have any of the following "at risk" factors?: Saliva - thick, dry mouth;Nutritional status - inadequate;Nutritional status - dependent feeder;Other - dysphagia Patient is HIGH RISK - Oral Care Protocol followed (see row info): Yes Patient is AT RISK  - Oral Care Protocol followed (see row info): Yes   Dysphagia Treatment Treatment focused on: Skilled observation of diet tolerance Treatment Methods/Modalities: Skilled observation;Differential diagnosis Patient observed directly with PO's: Yes Type of PO's observed: Dysphagia 1 (puree);Thin liquids Feeding: Total assist Liquids provided via: Cup;Straw;Teaspoon Oral Phase Signs & Symptoms: Prolonged bolus formation;Prolonged oral phase Pharyngeal Phase Signs & Symptoms: Suspected delayed swallow initiation;Delayed cough;Immediate cough Type of cueing:  (does not follow directions for compensations)   GO     [name], MS [facility] SLP [phone]

## 2012-12-31 NOTE — Progress Notes (Signed)
ANTIBIOTIC CONSULT NOTE - FOLLOW UP  Pharmacy Consult for Levaquin Indication: CAP  No Known Allergies  Patient Measurements: Height: 5\' 3"  (160 cm) Weight: 154 lb 8.7 oz (70.1 kg) IBW/kg (Calculated) : 56.9   Assessment:  72 yom on Levaquin 750mg  PO q24h for CAP treatment. Blood culture with NGTD, urine cx negative, strep pneumo and legionella urine ag negative. Patient is afebrile, WBC wnl, Scr stable/wnl (last labs on 7/10). Discharge orders written for Levaquin 750 mg po q24h.   Plan:   MD, given Levaquin at high doses of 750 mg PO q24h - ok to stop Levaquin after a 5 day course for CAP treatment.  Darrol Angel, PharmD Pager: [phone] 12/31/2012 1:27 PM

## 2012-12-31 NOTE — Progress Notes (Signed)
[facility] PROGRESS NOTE  Alan Rhodes ZOX:[id number] DOB: Aug 08, 1940 DOA: 12/24/2012 PCP: Alan Proffer, MD  Assessment/Plan:  1-CAP/Aspiration PNA - Levaquin day /8 - urine legionella antigen negative and strep antigen negative - sputum culture has been unable to obtain  - speech therapy evaluation completed and dysphagia 1 diet recommended. -will hold on MBS until findings and concerns discuss with guardian; overall outcome and changes in treatment invariable; but will help to confirmed diagnosis.  - Also treating for GERD as this may be contributing to clinical picture. - Physical therapy has evaluated patient and is currently recommending SNF. Currently awaiting pasaar number per social worker reports.  Social worker is currently involved and will contact me once pasaar number is available.  Discharge summary complete as well as med rec.   - Cleared medically for transition to SNF once able.  2 . Rheumatoid arthritis  - Will continue home regimen once discharged  3. Hx of Seizure d/o  - Will continue home regimen   4. Hypothyroidism  - continue synthroid at home regimen.  - TSH WNL  5. HPL  - stable continue statin   6. DVT prophylaxis  - heparin   7. Mass at lung - Dr. 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 He is a ward of the state and informed consent is not feasible. - Will have patient f/u with Dr. Sherene Rhodes for further evaluation and recommendations as outpatient.  Code Status: full Family Communication: no family at bedside Disposition Plan: Will plan on discharging to SNF once able.  Social worker currently working on disposition.  Consultants:  Physical therapy  Speech therapy  Procedures:  none  Antibiotics:  Levaquin   HPI/Subjective: Pt at baseline does not interact with people per discussion with caregiver. Patient continue to experience episodes of cough with food intake.  Objective: Filed Vitals:   12/30/12 0604 12/30/12 1400 12/30/12 2200 12/31/12 1438  BP: 102/46 112/53 114/76 104/73  Pulse: 95 83 69 92  Temp: 98.7 F (37.1 C) 98.3 F (36.8 C) 98.2 F (36.8 C) 97.7 F (36.5 C)  TempSrc: Oral Oral Oral Oral  Resp: 18 18 18 18   Height:      Weight:      SpO2: 97% 98%  93%    Intake/Output Summary (Last 24 hours) at 12/31/12 1446 Last data filed at 12/30/12 1736  Gross per 24 hour  Intake    240 ml  Output    200 ml  Net     40 ml   Filed Weights   12/24/12 1536  Weight: 70.1 kg (154 lb 8.7 oz)    Exam:   General:  Pt in NAD, Awake; not able to follow commands; afebrile  Cardiovascular: RRR, no MRG  Respiratory: No wheezes, no increased work of breathing.  Abdomen: soft, NT, ND  Musculoskeletal: no cyanosis or clubbing   Data Reviewed: Basic Metabolic Panel:  Recent Labs Lab 12/25/12 0502  NA 134*  K 4.0  CL 103  CO2 27  GLUCOSE 85  BUN 11  CREATININE 0.88  CALCIUM 8.6   CBC:  Recent Labs Lab 12/25/12 0502 12/25/12 1238  WBC 8.9 9.4  HGB 9.6* 11.4*  HCT 27.0* 32.7*  MCV 97.1 97.3  PLT 125* 148*    Recent Results (from the past 240 hour(s))  CULTURE, BLOOD (ROUTINE X 2)     Status: None   Collection Time    12/24/12 11:43 AM      Result Value Range Status   Specimen Description BLOOD RIGHT FOREARM   Final   Special Requests BOTTLES DRAWN AEROBIC AND ANAEROBIC 5 CC EACH   Final   Culture  Setup Time 12/24/2012 14:15   Final   Culture NO GROWTH 5 DAYS   Final   Report Status 12/30/2012 FINAL   Final  CULTURE, BLOOD (ROUTINE X 2)     Status: None   Collection Time    12/24/12 12:30 PM       Result Value Range Status   Specimen Description BLOOD LEFT ARM   Final   Special Requests BOTTLES DRAWN AEROBIC AND ANAEROBIC 5 CC EACH   Final   Culture  Setup Time 12/24/2012 14:16   Final   Culture NO GROWTH 5 DAYS   Final   Report Status 12/30/2012 FINAL   Final  URINE CULTURE     Status: None   Collection Time    12/24/12  1:09 PM      Result Value Range Status   Specimen Description URINE, CATHETERIZED   Final   Special Requests NONE   Final   Culture  Setup Time 12/24/2012 18:44   Final   Colony Count NO GROWTH   Final   Culture NO GROWTH   Final   Report Status 12/25/2012 FINAL   Final     Studies: No results found.  Scheduled Meds: . sodium chloride   Intravenous Once  . divalproex  1,000 mg Oral QHS  . divalproex  500 mg Oral Daily  . docusate sodium  100 mg Oral BID  . feeding supplement  237 mL Oral TID BM  . folic acid  1 mg Oral Daily  . heparin  5,000 Units Subcutaneous Q8H  . levofloxacin  750 mg Oral Q24H  . levothyroxine  25 mcg Oral QAC breakfast  . OXcarbazepine  300 mg Oral BID  . oxybutynin  10 mg Oral TID  . pantoprazole (PROTONIX) IV  40 mg Intravenous Daily  . polyethylene glycol  17 g Oral Daily  . risperiDONE  0.5 mg Oral Daily   And  . risperiDONE  1 mg Oral QHS  . simvastatin  5 mg Oral q1800   Continuous Infusions: . dextrose 5 % and 0.45 % NaCl with KCl 20 mEq/L 1,000 mL (12/25/12 0550)    Principal Problem:   CAP (community acquired pneumonia) Active Problems:   MR (mental retardation), severe   Seizure disorder   Autism   Rheumatoid arthritis    Time spent: < 35 minutes    Alan Rhodes  [facility] Pager [phone]. If 7PM-7AM, please contact night-coverage at [email or web address], password [facility] 12/31/2012, 2:46 PM  LOS: 7 days

## 2013-01-01 MED ORDER — DIVALPROEX SODIUM 125 MG PO CPSP: 500.0000 mg | ORAL_CAPSULE | Freq: Every day | ORAL | Status: DC

## 2013-01-01 MED ORDER — ENSURE PUDDING PO PUDG: 1.0000 | Freq: Three times a day (TID) | ORAL | Status: DC

## 2013-01-01 MED ORDER — DIVALPROEX SODIUM 125 MG PO CPSP: 1000.0000 mg | ORAL_CAPSULE | Freq: Every day | ORAL | Status: DC

## 2013-01-01 NOTE — Progress Notes (Signed)
CSW continues to check for level 2 passar. It is not yet available. Umaima Scholten C. Shenaya Lebo MSW, LCSW [phone]

## 2013-01-01 NOTE — Progress Notes (Signed)
[facility] PROGRESS NOTE  Alan Rhodes ZOX:[id number] DOB: 11-Apr-1941 DOA: 12/24/2012 PCP: Galvin Proffer, MD  Assessment/Plan:  1-CAP/Aspiration PNA - Patient completed antibiotic regimen as inpatient. - urine legionella antigen negative and strep antigen negative - sputum culture has been unable to obtain  - speech therapy evaluation completed and dysphagia 1 diet recommended. -will hold on MBS until findings and concerns discuss with guardian; overall outcome and changes in treatment invariable; but will help to confirmed diagnosis.  - Also treating for GERD as this may be contributing to clinical picture. - Physical therapy has evaluated patient and is currently recommending SNF. Currently awaiting pasaar number per social worker reports.  Social worker is currently involved and will contact me once pasaar number is available.  Discharge summary complete as well as med rec.   - Cleared medically for transition to SNF once able.  2 . Rheumatoid arthritis  - Will continue home regimen once discharged  3. Hx of Seizure d/o  - Will continue home regimen   4. Hypothyroidism  - continue synthroid at home regimen.  - TSH WNL  5. HLD  - stable continue statin   6. DVT prophylaxis  - heparin   7. Mass at lung - Dr. 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 He is a ward of the state and informed consent is not feasible. - Will have patient f/u with Dr. Sherene Sires for further evaluation and recommendations as outpatient.  Code Status: full Family Communication: no family at bedside Disposition Plan: Will plan  on discharging to SNF once able. Social worker currently working on disposition.  Consultants:  Physical therapy  Speech therapy  Procedures:  none  Antibiotics:  Levaquin   HPI/Subjective: Pt at baseline does not interact with people per discussion with caregiver. Patient continue to experience episodes of cough with food intake.  Objective: Filed Vitals:   12/31/12 1438 12/31/12 2244 01/01/13 0605 01/01/13 1400  BP: 104/73 109/68 108/65 95/61  Pulse: 92 76 64 67  Temp: 97.7 F (36.5 C) 98.8 F (37.1 C) 97.9 F (36.6 C) 98.3 F (36.8 C)  TempSrc: Oral Oral Oral Oral  Resp: 18 16 18 16   Height:      Weight:      SpO2: 93% 92% 93% 92%    Intake/Output Summary (Last 24 hours) at 01/01/13 1431 Last data filed at 01/01/13 0609  Gross per 24 hour  Intake      0 ml  Output    800 ml  Net   -800 ml   Filed Weights   12/24/12 1536  Weight: 70.1 kg (154 lb 8.7 oz)    Exam:   General:  Pt in NAD, Awake; not able to follow commands; afebrile  Cardiovascular: RRR, no MRG  Respiratory: No wheezes, no increased work of breathing.  Abdomen: soft, NT, ND  Musculoskeletal: no cyanosis or clubbing   Data Reviewed: Basic Metabolic Panel: No results found for this basename: NA, K, CL, CO2, GLUCOSE, BUN, CREATININE, CALCIUM, MG, PHOS,  in the last 168 hours CBC: No results found for this basename: WBC,  NEUTROABS, HGB, HCT, MCV, PLT,  in the last 168 hours  Recent Results (from the past 240 hour(s))  CULTURE, BLOOD (ROUTINE X 2)     Status: None   Collection Time    12/24/12 11:43 AM      Result Value Range Status   Specimen Description BLOOD RIGHT FOREARM   Final   Special Requests BOTTLES DRAWN AEROBIC AND ANAEROBIC 5 CC EACH   Final   Culture  Setup Time 12/24/2012 14:15   Final   Culture NO GROWTH 5 DAYS   Final   Report Status 12/30/2012 FINAL   Final  CULTURE, BLOOD (ROUTINE X 2)     Status: None   Collection Time    12/24/12 12:30 PM      Result Value  Range Status   Specimen Description BLOOD LEFT ARM   Final   Special Requests BOTTLES DRAWN AEROBIC AND ANAEROBIC 5 CC EACH   Final   Culture  Setup Time 12/24/2012 14:16   Final   Culture NO GROWTH 5 DAYS   Final   Report Status 12/30/2012 FINAL   Final  URINE CULTURE     Status: None   Collection Time    12/24/12  1:09 PM      Result Value Range Status   Specimen Description URINE, CATHETERIZED   Final   Special Requests NONE   Final   Culture  Setup Time 12/24/2012 18:44   Final   Colony Count NO GROWTH   Final   Culture NO GROWTH   Final   Report Status 12/25/2012 FINAL   Final     Studies: No results found.  Scheduled Meds: . sodium chloride   Intravenous Once  . divalproex  1,000 mg Oral QHS  . divalproex  500 mg Oral Daily  . docusate sodium  100 mg Oral BID  . feeding supplement  237 mL Oral TID BM  . feeding supplement  1 Container Oral TID BM  . folic acid  1 mg Oral Daily  . heparin  5,000 Units Subcutaneous Q8H  . levofloxacin  750 mg Oral Q24H  . levothyroxine  25 mcg Oral QAC breakfast  . OXcarbazepine  300 mg Oral BID  . oxybutynin  10 mg Oral TID  . polyethylene glycol  17 g Oral Daily  . risperiDONE  0.5 mg Oral Daily   And  . risperiDONE  1 mg Oral QHS  . simvastatin  5 mg Oral q1800   Continuous Infusions: . dextrose 5 % and 0.45 % NaCl with KCl 20 mEq/L 1,000 mL (12/25/12 0550)    Principal Problem:   CAP (community acquired pneumonia) Active Problems:   MR (mental retardation), severe   Seizure disorder   Autism   Rheumatoid arthritis    Time spent: < 35 minutes    Alan Rhodes  [facility] Pager [phone]. If 7PM-7AM, please contact night-coverage at [email or web address], password [facility] 01/01/2013, 2:31 PM  LOS: 8 days

## 2013-01-02 NOTE — Progress Notes (Signed)
[facility] PROGRESS NOTE  Alan Rhodes ZDG:[id number] DOB: 10/06/1940 DOA: 12/24/2012 PCP: Galvin Proffer, MD  Assessment/Plan:  1-CAP/Aspiration PNA - Patient completed antibiotic regimen as inpatient. - urine legionella antigen negative and strep antigen negative - sputum culture has been unable to obtain  - speech therapy evaluation completed and dysphagia 1 diet recommended. -will hold on MBS until findings and concerns discuss with guardian; overall outcome and changes in treatment invariable; but will help to confirmed diagnosis.  - Also treating for GERD as this may be contributing to clinical picture. - Physical therapy has evaluated patient and is currently recommending SNF. Currently awaiting pasaar number per social worker reports.  Social worker is currently involved and will contact me once pasaar number is available.  Discharge summary complete as well as med rec.   - Cleared medically for transition to SNF once able.  2 . Rheumatoid arthritis  - Will continue home regimen once discharged  3. Hx of Seizure d/o  - Will continue home regimen   4. Hypothyroidism  - continue synthroid at home regimen.  - TSH WNL  5. HLD  - stable continue statin   6. DVT prophylaxis  - heparin   7. Mass at lung - Dr. 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 He is a ward of the state and informed consent is not feasible. - Will have patient f/u with Dr. Sherene Sires for further evaluation and recommendations as outpatient.  Code Status: full Family Communication: no family at bedside Disposition Plan: Will plan  on discharging to SNF once able. Social worker currently working on disposition.  Consultants:  Physical therapy  Speech therapy  Procedures:  none  Antibiotics:  Levaquin   HPI/Subjective: Pt at baseline does not interact with people per discussion with caregiver. Patient continue to experience episodes of cough with food intake.  Objective: Filed Vitals:   01/01/13 1415 01/01/13 2200 01/02/13 0600 01/02/13 1424  BP: 100/60 98/81 90/85  113/71  Pulse:  65 82 72  Temp:  98.3 F (36.8 C) 98.6 F (37 C) 98.5 F (36.9 C)  TempSrc:  Oral Oral Oral  Resp:  18 18 16   Height:      Weight:      SpO2:  94% 95% 94%    Intake/Output Summary (Last 24 hours) at 01/02/13 1439 Last data filed at 01/02/13 0643  Gross per 24 hour  Intake      0 ml  Output    475 ml  Net   -475 ml   Filed Weights   12/24/12 1536  Weight: 70.1 kg (154 lb 8.7 oz)    Exam:   General:  Pt in NAD, Awake; not able to follow commands; afebrile  Cardiovascular: RRR, no MRG  Respiratory: No wheezes, no increased work of breathing.  Abdomen: soft, NT, ND  Musculoskeletal: no cyanosis or clubbing   Data Reviewed: Basic Metabolic Panel: No results found for this basename: NA, K, CL, CO2, GLUCOSE, BUN, CREATININE, CALCIUM, MG, PHOS,  in the last 168 hours CBC: No results found for this basename: WBC, NEUTROABS, HGB, HCT,  MCV, PLT,  in the last 168 hours  Recent Results (from the past 240 hour(s))  CULTURE, BLOOD (ROUTINE X 2)     Status: None   Collection Time    12/24/12 11:43 AM      Result Value Range Status   Specimen Description BLOOD RIGHT FOREARM   Final   Special Requests BOTTLES DRAWN AEROBIC AND ANAEROBIC 5 CC EACH   Final   Culture  Setup Time 12/24/2012 14:15   Final   Culture NO GROWTH 5 DAYS   Final   Report Status 12/30/2012 FINAL   Final  CULTURE, BLOOD (ROUTINE X 2)     Status: None   Collection Time    12/24/12 12:30 PM      Result Value Range Status   Specimen  Description BLOOD LEFT ARM   Final   Special Requests BOTTLES DRAWN AEROBIC AND ANAEROBIC 5 CC EACH   Final   Culture  Setup Time 12/24/2012 14:16   Final   Culture NO GROWTH 5 DAYS   Final   Report Status 12/30/2012 FINAL   Final  URINE CULTURE     Status: None   Collection Time    12/24/12  1:09 PM      Result Value Range Status   Specimen Description URINE, CATHETERIZED   Final   Special Requests NONE   Final   Culture  Setup Time 12/24/2012 18:44   Final   Colony Count NO GROWTH   Final   Culture NO GROWTH   Final   Report Status 12/25/2012 FINAL   Final     Studies: No results found.  Scheduled Meds: . divalproex  1,000 mg Oral QHS  . divalproex  500 mg Oral Daily  . docusate sodium  100 mg Oral BID  . feeding supplement  237 mL Oral TID BM  . feeding supplement  1 Container Oral TID BM  . folic acid  1 mg Oral Daily  . heparin  5,000 Units Subcutaneous Q8H  . levofloxacin  750 mg Oral Q24H  . levothyroxine  25 mcg Oral QAC breakfast  . OXcarbazepine  300 mg Oral BID  . oxybutynin  10 mg Oral TID  . polyethylene glycol  17 g Oral Daily  . risperiDONE  0.5 mg Oral Daily   And  . risperiDONE  1 mg Oral QHS  . simvastatin  5 mg Oral q1800   Continuous Infusions: . dextrose 5 % and 0.45 % NaCl with KCl 20 mEq/L 1,000 mL (12/25/12 0550)    Principal Problem:   CAP (community acquired pneumonia) Active Problems:   MR (mental retardation), severe   Seizure disorder   Autism   Rheumatoid arthritis    Time spent: < 35 minutes   Arieona Swaggerty  [facility] Pager [phone]. If 7PM-7AM, please contact night-coverage at [email or web address], password [facility] 01/02/2013, 2:39 PM  LOS: 9 days

## 2013-01-02 NOTE — Progress Notes (Signed)
CSW checked three times today on [facility] MUST re: pt level II pasarr.  No level II pasarr issued yet.   CSW will continue to follow and assist with pt discharge planning needs when level II pasarr received.  Jacklynn Lewis, MSW, LCSWA (coverage [phone]) Clinical Social Work

## 2013-01-02 NOTE — Progress Notes (Signed)
Physical Therapy Treatment Patient Details Name: Alan Rhodes MRN: [id number] DOB: 12-Mar-1941 Today's Date: 01/02/2013 Time: 4098-1191 PT Time Calculation (min): 19 min  PT Assessment / Plan / Recommendation  PT Comments   Pt requiring increased assistance this session. Max assist or bed mobility and Mod assist to stand at EOB. Unable to attempt ambulation with +1 assist. Continue to recommend SNF for continued rehab.   Follow Up Recommendations  SNF     Does the patient have the potential to tolerate intense rehabilitation     Barriers to Discharge        Equipment Recommendations  Rolling walker with 5" wheels    Recommendations for Other Services    Frequency Min 3X/week   Progress towards PT Goals Progress towards PT goals: Progressing toward goals (but increased assistance required this session)  Plan Current plan remains appropriate    Precautions / Restrictions Precautions Precautions: Fall Restrictions Weight Bearing Restrictions: No   Pertinent Vitals/Pain Pt unable to state    Mobility  Bed Mobility Bed Mobility: Supine to Sit;Sit to Supine Supine to Sit: 2: Max assist Sit to Supine: 2: Max assist Details for Bed Mobility Assistance: Increased assist for bed mobility. Increased time. Multimodal cues given. Transfers Transfers: Sit to Stand;Stand to Sit Sit to Stand: 3: Mod assist;From elevated surface;From bed Stand to Sit: 3: Mod assist;To elevated surface;To bed Details for Transfer Assistance: x3. Multimodal cues for safety, technique, hand placement. Increased assistance to rise, stabilize, control descent. Stood x3 for ~10-15 seconds each attempt. Weight shifted posteriorly.  Ambulation/Gait Ambulation/Gait Assistance: Not tested (comment) Ambulation/Gait Assistance Details: Unable to attermpt with +1 assist this session.     Exercises     PT Diagnosis:    PT Problem List:   PT Treatment Interventions:     PT Goals (current goals can now be  found in the care plan section)    Visit Information  Last PT Received On: 01/02/13 Assistance Needed: +2 History of Present Illness: 72 y.o. male with h/o autism, profound mental retardation, seizures, RA, bipolar, incontinent of bowel and bladder, PTSD admitted from group home with pneumonia. Per chart pt was ambulatory at group home PTA.     Subjective Data      Cognition  Cognition Arousal/Alertness: Awake/alert Behavior During Therapy: Flat affect Overall Cognitive Status: History of cognitive impairments - at baseline    Balance     End of Session PT - End of Session Equipment Utilized During Treatment: Gait belt Patient left: in bed;with call bell/phone within reach;with bed alarm set   GP     Rebeca Alert, MPT Pager: [phone]

## 2013-01-03 NOTE — Progress Notes (Signed)
[facility] PROGRESS NOTE  Alan Rhodes XBJ:[id number] DOB: 03/07/1941 DOA: 12/24/2012 PCP: Alan Proffer, MD  Assessment/Plan:  1-CAP/Aspiration PNA - Patient completed antibiotic regimen as inpatient. - urine legionella antigen negative and strep antigen negative -continue to be afebrile and less cough. -will hold on MBS for now. Per discussion with guardian they will like to have MBS and address code status and advance directives at the facility. Understand risk and appreciate communication and care. Will continue dysphagia 1 and follow SPT rec's - Also treating for GERD as this may be contributing to clinical picture. - Physical therapy has evaluated patient and is currently recommending SNF. Currently awaiting pasaar number per social worker reports.  Social worker is currently involved and will contact me once pasaar number is available.  Discharge summary complete as well as med rec.   - Cleared medically for transition to SNF once able.  2 . Rheumatoid arthritis  - Will continue home regimen once discharged -stable and no complaining of pain  3. Hx of Seizure d/o  - Will continue home regimen  -no seizure  4. Hypothyroidism  - continue synthroid at home regimen.  - TSH WNL  5. HLD  - stable continue statin   6. DVT prophylaxis  - heparin   7. Mass at lung - Dr. 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 He is a ward of the state and informed consent is not feasible. - Will have patient f/u with Dr. Sherene Rhodes for further evaluation and recommendations as outpatient.  Code Status: full Family  Communication: care discuss with guardian by phone. Disposition Plan: Will plan on discharging to SNF once able. Social worker currently working on disposition.  Consultants:  Physical therapy  Speech therapy  Procedures:  none  Antibiotics:  Levaquin   HPI/Subjective: Patient in no acute distress. Continue to have limited interaction and with some difficulty with intake (delay swallowing food bolus)  Objective: Filed Vitals:   01/02/13 0600 01/02/13 1424 01/02/13 2300 01/03/13 0600  BP: 90/85 113/71 106/70 90/52  Pulse: 82 72 61 59  Temp: 98.6 F (37 C) 98.5 F (36.9 C) 98.3 F (36.8 C) 98.3 F (36.8 C)  TempSrc: Oral Oral Oral Oral  Resp: 18 16 18 18   Height:      Weight:      SpO2: 95% 94% 92% 94%    Intake/Output Summary (Last 24 hours) at 01/03/13 1005 Last data filed at 01/03/13 0915  Gross per 24 hour  Intake     50 ml  Output    650 ml  Net   -600 ml   Filed Weights   12/24/12 1536  Weight: 70.1 kg (154 lb 8.7 oz)    Exam:   General:  Pt in NAD, Awake; not able to follow commands; afebrile  Cardiovascular: RRR, no MRG  Respiratory: No wheezes, no increased work of breathing.  Abdomen: soft, NT, ND  Musculoskeletal: no cyanosis or clubbing    Recent Results (from the past 240 hour(s))  CULTURE, BLOOD (ROUTINE X 2)     Status: None   Collection Time  12/24/12 11:43 AM      Result Value Range Status   Specimen Description BLOOD RIGHT FOREARM   Final   Special Requests BOTTLES DRAWN AEROBIC AND ANAEROBIC 5 CC EACH   Final   Culture  Setup Time 12/24/2012 14:15   Final   Culture NO GROWTH 5 DAYS   Final   Report Status 12/30/2012 FINAL   Final  CULTURE, BLOOD (ROUTINE X 2)     Status: None   Collection Time    12/24/12 12:30 PM      Result Value Range Status   Specimen Description BLOOD LEFT ARM   Final   Special Requests BOTTLES DRAWN AEROBIC AND ANAEROBIC 5 CC EACH   Final   Culture  Setup Time 12/24/2012 14:16   Final   Culture  NO GROWTH 5 DAYS   Final   Report Status 12/30/2012 FINAL   Final  URINE CULTURE     Status: None   Collection Time    12/24/12  1:09 PM      Result Value Range Status   Specimen Description URINE, CATHETERIZED   Final   Special Requests NONE   Final   Culture  Setup Time 12/24/2012 18:44   Final   Colony Count NO GROWTH   Final   Culture NO GROWTH   Final   Report Status 12/25/2012 FINAL   Final     Studies: No results found.  Scheduled Meds: . divalproex  1,000 mg Oral QHS  . divalproex  500 mg Oral Daily  . docusate sodium  100 mg Oral BID  . feeding supplement  237 mL Oral TID BM  . feeding supplement  1 Container Oral TID BM  . folic acid  1 mg Oral Daily  . heparin  5,000 Units Subcutaneous Q8H  . levofloxacin  750 mg Oral Q24H  . levothyroxine  25 mcg Oral QAC breakfast  . OXcarbazepine  300 mg Oral BID  . oxybutynin  10 mg Oral TID  . polyethylene glycol  17 g Oral Daily  . risperiDONE  0.5 mg Oral Daily   And  . risperiDONE  1 mg Oral QHS  . simvastatin  5 mg Oral q1800   Continuous Infusions: . 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 If 7PM-7AM, please contact night-coverage at [email or web address], password [facility] 01/03/2013, 10:05 AM  LOS: 10 days

## 2013-01-04 NOTE — Progress Notes (Signed)
[facility] PROGRESS NOTE  ERVIE MCCARD ZOX:[id number] DOB: Oct 11, 1940 DOA: 12/24/2012 PCP: Galvin Proffer, MD  Assessment/Plan:  1-CAP/Aspiration PNA -Patient completed antibiotic regimen as inpatient. -urine legionella antigen negative and strep antigen negative -continue to be afebrile and less cough. -will hold on MBS for now. Per discussion with guardian they will like to have MBS and address code status and advance directives at the facility. Understand risk and appreciate communication and care. Will continue dysphagia 1 and follow SPT rec's - Also treating for GERD as this may be contributing to clinical picture. - Physical therapy has evaluated patient and is currently recommending SNF. Currently awaiting pasaar number per social worker reports.  Social worker is currently involved and will contact me once pasaar number is available.  Discharge summary complete as well as med rec.   - Cleared medically for transition to SNF once able.  2 . Rheumatoid arthritis  - Will continue home regimen once discharged -stable and no complaining of pain  3. Hx of Seizure d/o  - Will continue home regimen  -no seizure  4. Hypothyroidism  - continue synthroid at home regimen.  - TSH WNL  5. HLD  - stable continue statin   6. DVT prophylaxis  - heparin   7. Mass at lung - Dr. 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 He is a ward of the state and informed consent is not feasible. - Will have patient f/u with Dr. Sherene Sires for further evaluation and recommendations as outpatient.  Code Status: full Family  Communication: care discuss with guardian by phone. Disposition Plan: Will plan on discharging to SNF once able. Social worker currently working on disposition.  Consultants:  Physical therapy  Speech therapy  Procedures:  none  Antibiotics:  Levaquin   HPI/Subjective: Patient in no acute distress; clinically unchanged and waiting PASAR state # for discharge. Continue to have limited interaction (baseline) and with some difficulty with intake (delay swallowing food bolus)  Objective: Filed Vitals:   01/03/13 0600 01/03/13 1317 01/03/13 2200 01/04/13 0600  BP: 90/52 96/58 106/71 128/84  Pulse: 59 59 60 60  Temp: 98.3 F (36.8 C) 97.7 F (36.5 C) 97.7 F (36.5 C) 97.7 F (36.5 C)  TempSrc: Oral Oral Axillary Axillary  Resp: 18 18 18 20   Height:      Weight:      SpO2: 94% 94% 94% 93%    Intake/Output Summary (Last 24 hours) at 01/04/13 0854 Last data filed at 01/04/13 0600  Gross per 24 hour  Intake    195 ml  Output    700 ml  Net   -505 ml   Filed Weights   12/24/12 1536  Weight: 70.1 kg (154 lb 8.7 oz)    Exam:   General:  Pt in NAD, Awake; not able to follow commands; afebrile  Cardiovascular: RRR, no MRG  Respiratory: No wheezes, no increased work of breathing.  Abdomen: soft, NT, ND  Musculoskeletal: no cyanosis or clubbing    No results found for this or any previous visit (from the past 240 hour(s)).  Studies: No results found.  Scheduled Meds: . divalproex  1,000 mg Oral QHS  . divalproex  500 mg Oral Daily  . docusate sodium  100 mg Oral BID  . feeding supplement  237 mL Oral TID BM  . feeding supplement  1 Container Oral TID BM  . folic acid  1 mg Oral Daily  . heparin  5,000 Units Subcutaneous Q8H  . levothyroxine  25 mcg Oral QAC breakfast  . OXcarbazepine  300 mg Oral BID  . oxybutynin  10 mg Oral TID  . polyethylene glycol  17 g Oral Daily  . risperiDONE  0.5 mg Oral Daily   And  . risperiDONE  1 mg Oral QHS  .  simvastatin  5 mg Oral q1800   Continuous Infusions: . 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 If 7PM-7AM, please contact night-coverage at [email or web address], password [facility] 01/04/2013, 8:54 AM  LOS: 11 days

## 2013-01-05 LAB — BASIC METABOLIC PANEL
CO2: 24 mEq/L (ref 19–32)
Calcium: 9 mg/dL (ref 8.4–10.5)
GFR calc non Af Amer: 89 mL/min — ABNORMAL LOW (ref >90)
Potassium: 4.2 mEq/L (ref 3.5–5.1)
Sodium: 132 mEq/L — ABNORMAL LOW (ref 135–145)

## 2013-01-05 NOTE — Progress Notes (Signed)
Physical Therapy Treatment Patient Details Name: Alan Rhodes MRN: [id number] DOB: 12-25-1940 Today's Date: 01/05/2013 Time: 5784-6962 PT Time Calculation (min): 27 min  PT Assessment / Plan / Recommendation  PT Comments   Pt alert and pleasant (singing). Assisted OOB to amb in hallway. Does well with repeat, functioanl cueing for direction and increased time to process.   Pt is from a Group Home admitted for PNA.  Pt now awaiting SNF placement as [facility] states they can no longer provide him his current level of care.    Follow Up Recommendations  SNF [facility] unable to take pt back)     Does the patient have the potential to tolerate intense rehabilitation     Barriers to Discharge        Equipment Recommendations       Recommendations for Other Services    Frequency Min 3X/week   Progress towards PT Goals Progress towards PT goals: Progressing toward goals  Plan      Precautions / Restrictions Precautions Precautions: Fall Precaution Comments: Hx  MR Restrictions Weight Bearing Restrictions: No    Pertinent Vitals/Pain No c/o pain    Mobility  Bed Mobility Bed Mobility: Supine to Sit;Sit to Supine Supine to Sit: 4: Min assist Details for Bed Mobility Assistance: Increased assist for bed mobility. Increased time. Multimodal cues given.to stay on task Transfers Transfers: Sit to Stand;Stand to Sit Sit to Stand: 4: Min assist;From bed;From chair/3-in-1;4: Min guard Stand to Sit: 4: Min guard;4: Min assist;To toilet;To chair/3-in-1 Details for Transfer Assistance: Multiple one word functional cues to increased self assist.  Repeated and alllowed increased time to process.  Pt does well.  Ambulation/Gait Ambulation/Gait Assistance: 4: Min assist;4: Min Government social research officer (Feet): 250 Feet Assistive device: Rolling walker Ambulation/Gait Assistance Details: VC's for direction and MinGuard assist for safety.  Fall risk due to decreased safety cognition/awareness/MR.   Gait Pattern: Step-through pattern;Decreased stride length;Shuffle;Trunk flexed;Narrow base of support Gait velocity: WFL     PT Goals (current goals can now be found in the care plan section)    Visit Information  Last PT Received On: 01/05/13 Assistance Needed: +2 (for amb safety) History of Present Illness: 72 y.o. male with h/o autism, profound mental retardation, seizures, RA, bipolar, incontinent of bowel and bladder, PTSD admitted from group home with pneumonia. Per chart pt was ambulatory at group home PTA.     Subjective Data      Cognition       Balance     End of Session PT - End of Session Equipment Utilized During Treatment: Gait belt Activity Tolerance: Patient tolerated treatment well Patient left: in chair;with call bell/phone within reach;with chair alarm set   Felecia Shelling  PTA [facility]  Acute  Rehab Pager      [phone]

## 2013-01-05 NOTE — Progress Notes (Signed)
[facility] PROGRESS NOTE  Alan Rhodes ZOX:[id number] DOB: August 15, 1940 DOA: 12/24/2012 PCP: Alan Proffer, MD  Assessment/Plan:  1-CAP/Aspiration PNA -Patient completed antibiotic regimen as inpatient. -urine legionella antigen negative and strep antigen negative -continue to be afebrile and less cough. -will hold on MBS for now. Per discussion with guardian they will like to have MBS and address code status and advance directives at the facility. Understand risk and appreciate communication and care. Will continue dysphagia 1 and follow SPT rec's - Also treating for GERD as this may be contributing to clinical picture. - Physical therapy has evaluated patient and is currently recommending SNF. Currently awaiting pasaar number per social worker reports.  Social worker is currently involved and will contact me once pasaar number is available.  Discharge summary complete as well as med rec.   - Cleared medically for transition to SNF once able.  2 . Rheumatoid arthritis  - Will continue home regimen once discharged -stable and no complaining of pain  3. Hx of Seizure d/o  - Will continue home regimen  -no seizure  4. Hypothyroidism  - continue synthroid at home regimen.  - TSH WNL  5. HLD  - stable continue statin   6. DVT prophylaxis  - heparin   7. Mass at lung - Alan Rhodes aware and based on his last note his assessment was the following: Most likely he is chronically aspirating with possible lipoid pna or has a benign form of rml syndrome but the only treatment option in this setting is rx with abx as he is and conservative f/u to see if R effusion needs to be tapped at some point - even this would be technically very difficult given his level of understanding and cooperation. He is a ward of the state and informed consent is not feasible. - Will have patient f/u with Alan Rhodes for further evaluation and recommendations as outpatient.  Code Status: full Family  Communication: care discuss with guardian by phone. Disposition Plan: Will plan on discharging to SNF once able. Social worker currently working on disposition.  Consultants:  Physical therapy  Speech therapy  Procedures:  none  Antibiotics:  Levaquin   HPI/Subjective: Patient in no acute distress; clinically unchanged and waiting PASAR state # for discharge. Continue to have limited interaction (baseline) and with some difficulty with intake (delay swallowing food bolus)  Objective: Filed Vitals:   01/04/13 0600 01/04/13 1322 01/04/13 2234 01/05/13 0655  BP: 128/84 91/57 112/84 140/86  Pulse: 60 72 70 65  Temp: 97.7 F (36.5 C) 97.8 F (36.6 C) 97.8 F (36.6 C) 98.1 F (36.7 C)  TempSrc: Axillary Axillary Oral Oral  Resp: 20 18 20 20   Height:      Weight:      SpO2: 93% 96% 96% 97%    Intake/Output Summary (Last 24 hours) at 01/05/13 1107 Last data filed at 01/05/13 0656  Gross per 24 hour  Intake      0 ml  Output    450 ml  Net   -450 ml   Filed Weights   12/24/12 1536  Weight: 70.1 kg (154 lb 8.7 oz)    Exam:   General:  Pt in NAD, Awake; not able to follow commands; afebrile  Cardiovascular: RRR, no MRG  Respiratory: No wheezes, no increased work of breathing.  Abdomen: soft, NT, ND  Musculoskeletal: no cyanosis or clubbing    No results found for this or any previous visit (from the past 240 hour(s)).  Studies: No results found.  Scheduled Meds: . divalproex  1,000 mg Oral QHS  . divalproex  500 mg Oral Daily  . docusate sodium  100 mg Oral BID  . feeding supplement  237 mL Oral TID BM  . feeding supplement  1 Container Oral TID BM  . folic acid  1 mg Oral Daily  . heparin  5,000 Units Subcutaneous Q8H  . levothyroxine  25 mcg Oral QAC breakfast  . OXcarbazepine  300 mg Oral BID  . oxybutynin  10 mg Oral TID  . polyethylene glycol  17 g Oral Daily  . risperiDONE  0.5 mg Oral Daily   And  . risperiDONE  1 mg Oral QHS  .  simvastatin  5 mg Oral q1800   Continuous Infusions: . 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 If 7PM-7AM, please contact night-coverage at [email or web address], password [facility] 01/05/2013, 11:07 AM  LOS: 12 days

## 2013-01-06 NOTE — Progress Notes (Signed)
Patient cleared for discharge. Level 2 passar obtained. Packet copied and placed in [place]. ptar called for transportation. CSW left voicemail for cheryl lackey at [employer].  Travaughn Vue C. Shahzad Thomann MSW, LCSW [phone]

## 2013-01-06 NOTE — Discharge Summary (Signed)
Physician Discharge Summary  Alan Rhodes:[id number] DOB: Aug 27, 1940 DOA: 12/24/2012  PCP: Galvin Proffer, MD  Admit date: 12/24/2012 Discharge date: 01/06/2013  Time spent: >30 minutes  Recommendations for Outpatient Follow-up:  -Please be sure to follow up with your primary care physician in 1-2 weeks or sooner should any new concerns arise. -Patient will need MBS and speech therapy evaluation/follow up at SNF. -discussion about code status and GOC/advance directives to be established -Palliative care consult at SNF  Discharge Diagnoses:  Principal Problem:   CAP (community acquired pneumonia) Active Problems:   MR (mental retardation), severe   Seizure disorder   Autism   Rheumatoid arthritis   Discharge Condition: stable and improved. Will be discharge to SNF.  Diet recommendation: Dysphagia 1 diet with thin liquids  Filed Weights   12/24/12 1536  Weight: 70.1 kg (154 lb 8.7 oz)    History of present illness:  72 y.o. male With Autism, severe MR, Seizure d/o, and Rheumatoid arthritis that presents to the ED after 1 week of cough. Patient does not hold conversations with people at baseline. Care giver reports that patient has not been as active as usual. Reports a decrease in oral intake and a non productive cough.    Hospital Course:  1-CAP/Aspiration PNA  -Patient completed antibiotic regimen as inpatient.  -urine legionella antigen negative and strep antigen negative  -continue to be afebrile and less cough.  -will hold on MBS for now. Per discussion with guardian they will like to have MBS and address code status and advance directives at the facility. Understand risk and appreciate communication and care. Will continue dysphagia 1 and follow SPT rec's  - Also treating for GERD as this may be contributing to clinical picture.  - Physical therapy has evaluated patient and is currently recommending SNF. Currently awaiting pasaar number per social worker reports.  Social worker is currently involved and will contact me once pasaar number is available. Discharge summary complete as well as med rec.  - Cleared medically for transition to SNF once able.   2 . Rheumatoid arthritis  - Will continue home medication regimen  -stable and no complaining of pain   3. Hx of Seizure d/o  - Will continue home regimen  -no seizure   4. Hypothyroidism  - continue synthroid at home regimen.  - TSH WNL   5. HLD  - stable continue statin   6. DVT prophylaxis  - heparin   7. Mass at lung  - Dr. Sherene Sires aware and based on his last note his assessment was the following:  Most likely he is chronically aspirating with possible lipoid pna or has a benign form of rml syndrome but the only treatment option in this setting is rx with abx as he is and conservative f/u to see if R effusion needs to be tapped at some point - even this would be technically very difficult given his level of understanding and cooperation. He is a ward of the state and informed consent is not feasible.  - Will have patient f/u with Dr. Sherene Sires for further evaluation and recommendations as outpatient.   Procedures:  See below for x-rays reports  Consultations:  Physical therapy  Speech therapy  Discharge Exam: Filed Vitals:   01/05/13 0655 01/05/13 1355 01/05/13 2058 01/06/13 0545  BP: 140/86 99/71 107/69 102/62  Pulse: 65 86 89 88  Temp: 98.1 F (36.7 C) 98.1 F (36.7 C) 97.6 F (36.4 C) 97.6 F (36.4 C)  TempSrc:  Oral Oral Other (Comment) Oral  Resp: 20 20 20 20   Height:      Weight:      SpO2: 97% 97% 100% 93%   General: Pt in NAD, Awake; not able to follow commands; afebrile  Cardiovascular: RRR, no MRG  Respiratory: No wheezes, no increased work of breathing.  Abdomen: soft, NT, ND  Musculoskeletal: no cyanosis or clubbing    Discharge Instructions  Discharge Orders   Future Appointments Provider Department Dept Phone   01/13/2013 10:15 AM Nyoka Cowden, MD [facility] [phone]   Future Orders Complete By Expires     Call MD for:  difficulty breathing, headache or visual disturbances  As directed     Call MD for:  extreme fatigue  As directed     Call MD for:  severe uncontrolled pain  As directed     Call MD for:  temperature >100.4  As directed     Diet - low sodium heart healthy  As directed     Discharge instructions  As directed     Comments:      -Please be sure to follow up with your primary care physician in 1-2 weeks or sooner should any new concerns arise. -Patient will need MBS and speech therapy evaluation/follow up at SNF. -discussion about code status and GOC/advance directives to be established -Palliative care consult at SNF     Increase activity slowly  As directed         Medication List    STOP taking these medications       DEPAKOTE 500 MG DR tablet  Generic drug:  divalproex  Replaced by:  divalproex 125 MG capsule      TAKE these medications       CALCARB 600/D 600-400 MG-UNIT per tablet  Generic drug:  Calcium Carbonate-Vitamin D  Take 1 tablet by mouth 3 (three) times daily with meals.     divalproex 125 MG capsule  Commonly known as:  DEPAKOTE SPRINKLE  Take 8 capsules (1,000 mg total) by mouth at bedtime.     divalproex 125 MG capsule  Commonly known as:  DEPAKOTE SPRINKLE  Take 4 capsules (500 mg total) by mouth daily.     docusate sodium 100 MG capsule  Commonly known as:  COLACE  Take 100 mg by mouth 2 (two) times daily.     feeding supplement Pudg  Take 1 Container by mouth 3 (three) times daily between meals.     folic acid 1 MG tablet  Commonly known as:  FOLVITE  Take 1 mg by mouth daily.     levothyroxine 25 MCG tablet  Commonly known as:  SYNTHROID, LEVOTHROID  Take 25 mcg by mouth daily before breakfast.     LORazepam 0.5 MG tablet  Commonly known as:  ATIVAN  Take 0.5 mg by mouth 3 (three) times daily as needed for anxiety.     lovastatin 20 MG tablet  Commonly  known as:  MEVACOR  Take 20 mg by mouth at bedtime.     methotrexate 2.5 MG tablet  Commonly known as:  RHEUMATREX  Take 12.5 mg by mouth once a week. Caution:Chemotherapy. Protect from light.  5 tabs     MIRALAX powder  Generic drug:  polyethylene glycol powder  Take 17 g by mouth daily.     OXcarbazepine 150 MG tablet  Commonly known as:  TRILEPTAL  Take 300 mg by mouth 2 (two) times daily.  oxybutynin 5 MG tablet  Commonly known as:  DITROPAN  Take 10 mg by mouth 3 (three) times daily.     pantoprazole 20 MG tablet  Commonly known as:  PROTONIX  Take 1 tablet (20 mg total) by mouth daily.     predniSONE 1 MG tablet  Commonly known as:  DELTASONE  Take 1 mg by mouth daily.     risperiDONE 0.5 MG tablet  Commonly known as:  RISPERDAL  Take 0.5-1 mg by mouth 2 (two) times daily. Patient takes 0.5mg  in the morning and 1 mg at bedtime       No Known Allergies   The results of significant diagnostics from this hospitalization (including imaging, microbiology, ancillary and laboratory) are listed below for reference.    Significant Diagnostic Studies: Dg Chest 2 View  12/24/2012   *RADIOLOGY REPORT*  Clinical Data: Chest pain.  CHEST - 2 VIEW  Comparison: Chest CT 12/08/2012.  Findings: Redemonstrated, circumscribed 4 cm mass in the right middle lobe.  Prior CT better characterized central fat and calcification, possibly hamartoma or lipoid pneumonia.  Ill defined opacity at the left base is new from prior.  Small bilateral pleural effusions.  Unchanged prominence of the ascending aorta, which is elongated and accentuated by rightward rotation.  No change in heart size.  Lower thoracic compression fracture, present since at least December 05, 2012.  IMPRESSION:  1.  New ill-defined opacity at the left base, which may represent pneumonia in the appropriate clinical setting.  2.  Right middle lobe mass, reference chest CT 12/08/2012. 3.  Small bilateral pleural effusions.   Original  Report Authenticated By: Tiburcio Pea   Ct Chest Wo Contrast  12/08/2012   *RADIOLOGY REPORT*  Clinical Data: Right lung mass on chest radiograph.  Cough.  CT CHEST WITHOUT CONTRAST  Technique:  Multidetector CT imaging of the chest was performed following the standard protocol without IV contrast.  Comparison: Chest radiograph 09/17/2006.  Findings: Mediastinal lymph nodes are not enlarged by CT size criteria.  Hilar regions are difficult to definitively evaluate without IV contrast.  No axillary adenopathy.  Atherosclerotic calcification of the arterial vasculature, including coronary arteries.  Heart size normal.  No pericardial effusion.  Small hiatal hernia.  Small right pleural effusion. Respiratory motion degrades image quality.  A heterogeneous mass-like lesion in the right middle lobe measures 4.9 x 5.9 cm and contains foci of fat (example series 3, image 33) and calcification.  There is obstruction of the right middle lobe segmental bronchi. When today's scout view is compared with examination of 09/17/2006, a similar mass-like density was seen at the right lung base on the prior exam.  Fat densities are seen in the lower lobes, right greater than left (image 35).  Peribronchovascular nodularity appears somewhat ill- defined in the superior segment left lower lobe.  Incidental imaging of the upper abdomen shows no acute findings. No worrisome lytic or sclerotic lesions.  Degenerative changes are seen in the spine.  Probable bone island in the left 3rd posterolateral rib.  Lower thoracic compression fractures are age indeterminate.  IMPRESSION:  1. Heterogeneous mass-like lesion in the right middle lobe with foci of fat and calcification, possibly similar when today's scout view is compared with prior radiograph performed on 09/17/2006. Additional areas of fat density in the lower lobes bilaterally. Collectively, findings may be due to lipoid pneumonia.  However, heterogeneous mass-like appearance,  location in the right middle lobe and adjacent right pleural effusion warrant consideration of superimposed  malignancy. 2.  Lower thoracic compression fractures are indeterminate in age.   Original Report Authenticated By: Leanna Battles, M.D.   Labs: Basic Metabolic Panel:  Recent Labs Lab 01/05/13 0510  NA 132*  K 4.2  CL 102  CO2 24  GLUCOSE 82  BUN 14  CREATININE 0.79  CALCIUM 9.0    Signed:  Kapri Nero  [facility] 01/06/2013, 9:55 AM

## 2013-01-06 NOTE — Progress Notes (Signed)
Patient d/c to [facility] via ambulance, alert,stable.Hulda Marin RN

## 2013-01-06 NOTE — Progress Notes (Signed)
Report given to Elvia Collum LPN at [facility]- Hulda Marin RN

## 2013-01-13 ENCOUNTER — Ambulatory Visit: Payer: Medicare Other | Admitting: Internal Medicine

## 2013-01-20 ENCOUNTER — Ambulatory Visit: Payer: Medicare Other | Admitting: Internal Medicine

## 2013-02-04 NOTE — Progress Notes (Signed)
Above note reviewed.  Alan Rhodes

## 2015-03-03 ENCOUNTER — Encounter (HOSPITAL_COMMUNITY): Payer: Self-pay

## 2015-03-03 ENCOUNTER — Inpatient Hospital Stay (HOSPITAL_COMMUNITY)
Admission: EM | Admit: 2015-03-03 | Discharge: 2015-03-05 | DRG: 194 | Disposition: A | Discharge status: Home / Self Care | Payer: Medicare Other | Attending: Internal Medicine | Admitting: Internal Medicine

## 2015-03-03 ENCOUNTER — Emergency Department (HOSPITAL_COMMUNITY): Payer: Medicare Other

## 2015-03-03 DIAGNOSIS — N400 Enlarged prostate without lower urinary tract symptoms: Secondary | ICD-10-CM | POA: Diagnosis present

## 2015-03-03 DIAGNOSIS — Y95 Nosocomial condition: Secondary | ICD-10-CM | POA: Diagnosis present

## 2015-03-03 DIAGNOSIS — G40909 Epilepsy, unspecified, not intractable, without status epilepticus: Secondary | ICD-10-CM

## 2015-03-03 DIAGNOSIS — F840 Autistic disorder: Secondary | ICD-10-CM | POA: Diagnosis present

## 2015-03-03 DIAGNOSIS — F73 Profound intellectual disabilities: Secondary | ICD-10-CM | POA: Diagnosis present

## 2015-03-03 DIAGNOSIS — F319 Bipolar disorder, unspecified: Secondary | ICD-10-CM | POA: Diagnosis present

## 2015-03-03 DIAGNOSIS — K219 Gastro-esophageal reflux disease without esophagitis: Secondary | ICD-10-CM | POA: Diagnosis present

## 2015-03-03 DIAGNOSIS — J189 Pneumonia, unspecified organism: Secondary | ICD-10-CM | POA: Diagnosis present

## 2015-03-03 DIAGNOSIS — F4310 Post-traumatic stress disorder, unspecified: Secondary | ICD-10-CM | POA: Diagnosis present

## 2015-03-03 DIAGNOSIS — F72 Severe intellectual disabilities: Secondary | ICD-10-CM | POA: Diagnosis present

## 2015-03-03 DIAGNOSIS — E785 Hyperlipidemia, unspecified: Secondary | ICD-10-CM | POA: Diagnosis present

## 2015-03-03 DIAGNOSIS — R531 Weakness: Secondary | ICD-10-CM | POA: Diagnosis present

## 2015-03-03 DIAGNOSIS — M069 Rheumatoid arthritis, unspecified: Secondary | ICD-10-CM | POA: Diagnosis present

## 2015-03-03 DIAGNOSIS — R918 Other nonspecific abnormal finding of lung field: Secondary | ICD-10-CM

## 2015-03-03 LAB — BASIC METABOLIC PANEL
ANION GAP: 8 (ref 5–15)
BUN: 12 mg/dL (ref 6–20)
CALCIUM: 9.5 mg/dL (ref 8.9–10.3)
CHLORIDE: 98 mmol/L — AB (ref 101–111)
CO2: 23 mmol/L (ref 22–32)
Creatinine, Ser: 0.67 mg/dL (ref 0.61–1.24)
GFR calc Af Amer: >60 mL/min (ref >60)
GFR calc non Af Amer: >60 mL/min (ref >60)
GLUCOSE: 143 mg/dL — AB (ref 65–99)
Potassium: 4.2 mmol/L (ref 3.5–5.1)
Sodium: 129 mmol/L — ABNORMAL LOW (ref 135–145)

## 2015-03-03 LAB — URINALYSIS, ROUTINE W REFLEX MICROSCOPIC
Bilirubin Urine: NEGATIVE
GLUCOSE, UA: NEGATIVE mg/dL
HGB URINE DIPSTICK: NEGATIVE
Ketones, ur: NEGATIVE mg/dL
Leukocytes, UA: NEGATIVE
Nitrite: NEGATIVE
Protein, ur: NEGATIVE mg/dL
SPECIFIC GRAVITY, URINE: 1.022 (ref 1.005–1.030)
Urobilinogen, UA: 1 mg/dL (ref 0.0–1.0)
pH: 6.5 (ref 5.0–8.0)

## 2015-03-03 LAB — CBC WITH DIFFERENTIAL/PLATELET
Basophils Absolute: 0 10*3/uL (ref 0.0–0.1)
Basophils Relative: 0 %
EOS PCT: 0 %
Eosinophils Absolute: 0 10*3/uL (ref 0.0–0.7)
HEMATOCRIT: 42.6 % (ref 39.0–52.0)
Hemoglobin: 15.2 g/dL (ref 13.0–17.0)
LYMPHS ABS: 0.9 10*3/uL (ref 0.7–4.0)
Lymphocytes Relative: 18 %
MCH: 32.7 pg (ref 26.0–34.0)
MCHC: 35.7 g/dL (ref 30.0–36.0)
MCV: 91.6 fL (ref 78.0–100.0)
MONOS PCT: 9 %
Monocytes Absolute: 0.4 10*3/uL (ref 0.1–1.0)
NEUTROS ABS: 3.6 10*3/uL (ref 1.7–7.7)
Neutrophils Relative %: 73 %
Platelets: 66 10*3/uL — ABNORMAL LOW (ref 150–400)
RBC: 4.65 MIL/uL (ref 4.22–5.81)
RDW: 15.3 % (ref 11.5–15.5)
WBC: 4.9 10*3/uL (ref 4.0–10.5)

## 2015-03-03 LAB — I-STAT CG4 LACTIC ACID, ED: Lactic Acid, Venous: 0.98 mmol/L (ref 0.5–2.0)

## 2015-03-03 MED ORDER — VANCOMYCIN HCL IN DEXTROSE 1-5 GM/200ML-% IV SOLN
1000.0000 mg | Freq: Once | INTRAVENOUS | Status: AC
  Administered 2015-03-04: 1000 mg via INTRAVENOUS
  Filled 2015-03-03: qty 200

## 2015-03-03 MED ORDER — DEXTROSE 5 % IV SOLN
2.0000 g | Freq: Three times a day (TID) | INTRAVENOUS | Status: DC
  Administered 2015-03-03 – 2015-03-05 (×5): 2 g via INTRAVENOUS
  Filled 2015-03-03 (×9): qty 2

## 2015-03-03 MED ORDER — SODIUM CHLORIDE 0.9 % IV BOLUS (SEPSIS)
1000.0000 mL | Freq: Once | INTRAVENOUS | Status: AC
  Administered 2015-03-03: 1000 mL via INTRAVENOUS

## 2015-03-03 NOTE — ED Notes (Signed)
Pt is gone to x-ray will get EKG when he returns.

## 2015-03-03 NOTE — ED Notes (Signed)
Pt here with group home advisor.  Pt went to day program.  Was told that he has not voided today.  Pt is lethargic.  Pt has hx of UTI with same symptoms x 68month ago.  Pt baseline is nonverbal to questioning but does speak some words.  Not eating well today.  All symptoms started today.  Unknown for fever.

## 2015-03-03 NOTE — ED Notes (Signed)
Pt is from a group home. He is here with caregiver/representatives from the group home. He has a history of UTI. Pt is nonverbal. Pt appears relaxed. Per caregiver, pt is at baseline for mental status and affect at this time.

## 2015-03-03 NOTE — ED Notes (Signed)
Pt to xray

## 2015-03-03 NOTE — ED Provider Notes (Signed)
CSN: [id number]     Arrival date & time 03/03/15  1617 History   First MD Initiated Contact with Patient 03/03/15 1836     Chief Complaint  Patient presents with  . Urinary Retention  . Weakness     HPI   Alan Rhodes is a 74 y.o. male with a PMH of bipolar disorder and seizures who presents to the ED with urinary retention and generalized weakness, which started today. Patient is in the ED with his group home advisor, who reports the patient was noted to have decreased appetite, poor fluid intake, and urinary retention today. He was seen at an outside facility in August for the same symptoms, at which time he was found to have a UTI. He was discharged on Bactrim. Patient is nonverbal, which is consistent with his baseline.  Past Medical History  Diagnosis Date  . Autism   . PTSD (post-traumatic stress disorder)   . Bipolar disorder, unspecified   . Profound mental retardation   . Seizure disorder   . Rheumatoid arteritis   . Constipation   . Incontinence of bowel   . Bladder incontinence    Past Surgical History  Procedure Laterality Date  . No past surgeries     Family History  Problem Relation Age of Onset  . Family history unknown: Yes   Social History  Substance Use Topics  . Smoking status: Never Smoker   . Smokeless tobacco: Never Used  . Alcohol Use: No     Review of Systems  Unable to perform ROS: Patient nonverbal      Allergies  Review of patient's allergies indicates no known allergies.  Home Medications   Prior to Admission medications   Medication Sig Start Date End Date Taking? Authorizing Provider  Calcium Carbonate-Vitamin D (CALCARB 600/D) 600-400 MG-UNIT per tablet Take 1 tablet by mouth daily.    Yes Historical Provider, MD  divalproex (DEPAKOTE) 125 MG DR tablet Take 400-625 mg by mouth 2 (two) times daily. Take 4 tablets (500 mg) in the am & Take 5 tablets (625 mg) in the pm.   Yes Historical Provider, MD  folic acid (FOLVITE) 1 MG  tablet Take 1 mg by mouth daily.   Yes Historical Provider, MD  Oxcarbazepine (TRILEPTAL) 300 MG tablet Take 300 mg by mouth 2 (two) times daily.   Yes Historical Provider, MD  oxybutynin (DITROPAN) 5 MG tablet Take 10 mg by mouth 3 (three) times daily. At 8 am, 2 pm & 8 pm.   Yes Historical Provider, MD  oxybutynin (DITROPAN) 5 MG tablet Take 10 mg by mouth daily. At 2 pm.   Yes Historical Provider, MD  pantoprazole (PROTONIX) 40 MG tablet Take 40 mg by mouth daily.   Yes Historical Provider, MD  polyethylene glycol powder (MIRALAX) powder Take 17 g by mouth daily.   Yes Historical Provider, MD  pravastatin (PRAVACHOL) 20 MG tablet Take 20 mg by mouth daily.   Yes Historical Provider, MD  predniSONE (DELTASONE) 5 MG tablet Take 5 mg by mouth daily with breakfast.   Yes Historical Provider, MD  risperiDONE (RISPERDAL) 0.5 MG tablet Take 0.5 mg by mouth daily. At 4 pm.   Yes Historical Provider, MD  risperiDONE (RISPERDAL) 1 MG tablet Take 1 mg by mouth 2 (two) times daily. At 8 am & 8 pm.   Yes Historical Provider, MD  tamsulosin (FLOMAX) 0.4 MG CAPS capsule Take 0.4 mg by mouth at bedtime.   Yes Historical Provider, MD  divalproex (DEPAKOTE SPRINKLE) 125 MG capsule Take 8 capsules (1,000 mg total) by mouth at bedtime. 01/01/13   Vassie Loll, MD  divalproex (DEPAKOTE SPRINKLE) 125 MG capsule Take 4 capsules (500 mg total) by mouth daily. Patient not taking: Reported on 03/03/2015 01/01/13   Vassie Loll, MD  feeding supplement (ENSURE) PUDG Take 1 Container by mouth 3 (three) times daily between meals. Patient not taking: Reported on 03/03/2015 01/01/13   Vassie Loll, MD  pantoprazole (PROTONIX) 20 MG tablet Take 1 tablet (20 mg total) by mouth daily. Patient not taking: Reported on 03/03/2015 12/26/12   [name], MD    BP 145/93 mmHg  Pulse 79  Temp(Src) 97.9 F (36.6 C) (Oral)  Resp 18  SpO2 100% Physical Exam  Constitutional: He is oriented to person, place, and time. He appears  well-developed and well-nourished. No distress.  HENT:  Head: Normocephalic and atraumatic.  Right Ear: External ear normal.  Left Ear: External ear normal.  Nose: Nose normal.  Mouth/Throat: Uvula is midline, oropharynx is clear and moist and mucous membranes are normal.  Eyes: Conjunctivae, EOM and lids are normal. Pupils are equal, round, and reactive to light. Right eye exhibits no discharge. Left eye exhibits no discharge. No scleral icterus.  Neck: Normal range of motion. Neck supple.  Cardiovascular: Normal rate, regular rhythm, normal heart sounds, intact distal pulses and normal pulses.   Pulmonary/Chest: Effort normal and breath sounds normal. No respiratory distress. He has no wheezes. He has no rales.  Abdominal: Soft. Normal appearance and bowel sounds are normal. He exhibits no distension and no mass. There is no tenderness. There is no rigidity, no rebound and no guarding.  Musculoskeletal: Normal range of motion. He exhibits no edema or tenderness.  Neurological: He is alert and oriented to person, place, and time.  Skin: Skin is warm, dry and intact. No rash noted. He is not diaphoretic. No erythema. No pallor.  Psychiatric: He is noncommunicative.  Nursing note and vitals reviewed.   ED Course  Procedures (including critical care time)  Labs Review Labs Reviewed  CBC WITH DIFFERENTIAL/PLATELET - Abnormal; Notable for the following:    Platelets 66 (*)    All other components within normal limits  BASIC METABOLIC PANEL - Abnormal; Notable for the following:    Sodium 129 (*)    Chloride 98 (*)    Glucose, Bld 143 (*)    All other components within normal limits  URINALYSIS, ROUTINE W REFLEX MICROSCOPIC (NOT AT [facility])  VALPROIC ACID LEVEL  AMMONIA  10-HYDROXYCARBAZEPINE  I-STAT TROPOININ, ED  I-STAT CG4 LACTIC ACID, ED  I-STAT CG4 LACTIC ACID, ED    Imaging Review Dg Chest 2 View  03/03/2015   CLINICAL DATA:  Weakness and urinary retention  EXAM: CHEST  2  VIEW  COMPARISON:  January 28, 2015 and December 24, 2012  FINDINGS: There is consolidation in the right middle lobe. There is patchy atelectasis in the left base. Lungs elsewhere clear. Heart size and pulmonary vascularity are normal. No adenopathy. There is anterior wedging of several lower thoracic and upper lumbar vertebral bodies, stable.  IMPRESSION: Right middle lobe consolidation. Atelectasis left base. Followup PA and lateral chest radiographs recommended in 3-4 weeks following trial of antibiotic therapy to ensure resolution and exclude underlying malignancy.   Electronically Signed   By: Bretta Bang III M.D.   On: 03/03/2015 21:59     I have personally reviewed and evaluated these images and lab results as part of my  medical decision-making.   EKG Interpretation   Date/Time:  Thursday March 03 2015 21:37:48 EDT Ventricular Rate:  78 PR Interval:  154 QRS Duration: 95 QT Interval:  368 QTC Calculation: 419 R Axis:   -40 Text Interpretation:  Sinus rhythm Left anterior fascicular block  Borderline low voltage, extremity leads No previous tracing Confirmed by  NGUYEN, EMILY ([id number]) on 03/03/2015 11:59:52 PM      MDM   Final diagnoses:  HCAP (healthcare-associated pneumonia)    74 year old male presents to the ED with decreased appetite, poor fluid intake, and fatigue. He was also noted by his group home advisor to have decreased urine output today. Patient is nonverbal, which is baseline for him.  Patient is afebrile. Vital signs stable. Heart regular rate and rhythm, lungs clear auscultation bilaterally, abdomen soft, nontender, nondistended. No lower extremity edema. Bladder scan with 82 cc residual.  EKG no acute ischemia. CBC negative for leukocytosis, platelets 66. BMP with sodium 129. UA negative for infection. Lactic acid within normal limits. Chest x-ray demonstrates right middle lobe consolidation and atelectasis of the left lung base. Patient started on HCAP  antibiotics in the emergency department.  Hospitalist consulted. Patient to be admitted for further evaluation and management.  BP 145/93 mmHg  Pulse 79  Temp(Src) 97.9 F (36.6 C) (Oral)  Resp 18  SpO2 100%     Mady Gemma, PA-C 03/04/15 0221  Leta Baptist, MD 03/04/15 [phone]

## 2015-03-04 ENCOUNTER — Inpatient Hospital Stay (HOSPITAL_COMMUNITY): Payer: Medicare Other

## 2015-03-04 ENCOUNTER — Encounter (HOSPITAL_COMMUNITY): Payer: Self-pay | Admitting: Internal Medicine

## 2015-03-04 DIAGNOSIS — M069 Rheumatoid arthritis, unspecified: Secondary | ICD-10-CM

## 2015-03-04 DIAGNOSIS — F72 Severe intellectual disabilities: Secondary | ICD-10-CM

## 2015-03-04 LAB — CBC WITH DIFFERENTIAL/PLATELET
Basophils Absolute: 0 10*3/uL (ref 0.0–0.1)
Basophils Relative: 0 %
EOS ABS: 0 10*3/uL (ref 0.0–0.7)
Eosinophils Relative: 1 %
HCT: 41.1 % (ref 39.0–52.0)
HEMOGLOBIN: 13.8 g/dL (ref 13.0–17.0)
LYMPHS ABS: 1.7 10*3/uL (ref 0.7–4.0)
LYMPHS PCT: 29 %
MCH: 31.6 pg (ref 26.0–34.0)
MCHC: 33.6 g/dL (ref 30.0–36.0)
MCV: 94.1 fL (ref 78.0–100.0)
Monocytes Absolute: 0.9 10*3/uL (ref 0.1–1.0)
Monocytes Relative: 15 %
NEUTROS PCT: 56 %
Neutro Abs: 3.3 10*3/uL (ref 1.7–7.7)
Platelets: 73 10*3/uL — ABNORMAL LOW (ref 150–400)
RBC: 4.37 MIL/uL (ref 4.22–5.81)
RDW: 15.3 % (ref 11.5–15.5)
WBC: 5.9 10*3/uL (ref 4.0–10.5)

## 2015-03-04 LAB — AMMONIA: Ammonia: 37 umol/L — ABNORMAL HIGH (ref 9–35)

## 2015-03-04 LAB — COMPREHENSIVE METABOLIC PANEL
ALT: 9 U/L — ABNORMAL LOW (ref 17–63)
AST: 20 U/L (ref 15–41)
Albumin: 3 g/dL — ABNORMAL LOW (ref 3.5–5.0)
Alkaline Phosphatase: 54 U/L (ref 38–126)
Anion gap: 6 (ref 5–15)
BUN: 8 mg/dL (ref 6–20)
CHLORIDE: 107 mmol/L (ref 101–111)
CO2: 25 mmol/L (ref 22–32)
Calcium: 9.2 mg/dL (ref 8.9–10.3)
Creatinine, Ser: 0.68 mg/dL (ref 0.61–1.24)
Glucose, Bld: 84 mg/dL (ref 65–99)
POTASSIUM: 4.2 mmol/L (ref 3.5–5.1)
Sodium: 138 mmol/L (ref 135–145)
Total Bilirubin: 0.8 mg/dL (ref 0.3–1.2)
Total Protein: 5.8 g/dL — ABNORMAL LOW (ref 6.5–8.1)

## 2015-03-04 LAB — VALPROIC ACID LEVEL: Valproic Acid Lvl: 70 ug/mL (ref 50.0–100.0)

## 2015-03-04 LAB — I-STAT CG4 LACTIC ACID, ED: LACTIC ACID, VENOUS: 1.11 mmol/L (ref 0.5–2.0)

## 2015-03-04 LAB — HIV ANTIBODY (ROUTINE TESTING W REFLEX): HIV SCREEN 4TH GENERATION: NONREACTIVE

## 2015-03-04 MED ORDER — ENOXAPARIN SODIUM 40 MG/0.4ML ~~LOC~~ SOLN
40.0000 mg | SUBCUTANEOUS | Status: DC
  Administered 2015-03-04 – 2015-03-05 (×2): 40 mg via SUBCUTANEOUS
  Filled 2015-03-04 (×3): qty 0.4

## 2015-03-04 MED ORDER — FOLIC ACID 1 MG PO TABS
1.0000 mg | ORAL_TABLET | Freq: Every day | ORAL | Status: DC
  Administered 2015-03-04 – 2015-03-05 (×2): 1 mg via ORAL
  Filled 2015-03-04 (×2): qty 1

## 2015-03-04 MED ORDER — DIVALPROEX SODIUM 125 MG PO DR TAB
625.0000 mg | DELAYED_RELEASE_TABLET | Freq: Every day | ORAL | Status: DC
  Administered 2015-03-04: 625 mg via ORAL
  Filled 2015-03-04 (×2): qty 5

## 2015-03-04 MED ORDER — PRAVASTATIN SODIUM 20 MG PO TABS
20.0000 mg | ORAL_TABLET | Freq: Every day | ORAL | Status: DC
  Administered 2015-03-04: 20 mg via ORAL
  Filled 2015-03-04 (×2): qty 1

## 2015-03-04 MED ORDER — POLYETHYLENE GLYCOL 3350 17 G PO PACK
17.0000 g | PACK | Freq: Every day | ORAL | Status: DC
  Administered 2015-03-04 – 2015-03-05 (×2): 17 g via ORAL
  Filled 2015-03-04 (×3): qty 1

## 2015-03-04 MED ORDER — TAMSULOSIN HCL 0.4 MG PO CAPS
0.4000 mg | ORAL_CAPSULE | Freq: Every day | ORAL | Status: DC
  Administered 2015-03-04: 0.4 mg via ORAL
  Filled 2015-03-04: qty 1

## 2015-03-04 MED ORDER — DIVALPROEX SODIUM 250 MG PO DR TAB
500.0000 mg | DELAYED_RELEASE_TABLET | Freq: Every day | ORAL | Status: DC
  Administered 2015-03-04 – 2015-03-05 (×2): 500 mg via ORAL
  Filled 2015-03-04 (×3): qty 2

## 2015-03-04 MED ORDER — DEXTROSE 5 % IV SOLN: 2.0000 g | Freq: Three times a day (TID) | INTRAVENOUS | Status: DC

## 2015-03-04 MED ORDER — OXYBUTYNIN CHLORIDE 5 MG PO TABS: 10.0000 mg | ORAL_TABLET | Freq: Every day | ORAL | Status: DC

## 2015-03-04 MED ORDER — OXYBUTYNIN CHLORIDE 5 MG PO TABS
10.0000 mg | ORAL_TABLET | ORAL | Status: DC
  Administered 2015-03-04 – 2015-03-05 (×4): 10 mg via ORAL
  Filled 2015-03-04 (×8): qty 2

## 2015-03-04 MED ORDER — PREDNISONE 5 MG PO TABS
5.0000 mg | ORAL_TABLET | Freq: Every day | ORAL | Status: DC
  Administered 2015-03-04 – 2015-03-05 (×2): 5 mg via ORAL
  Filled 2015-03-04 (×4): qty 1

## 2015-03-04 MED ORDER — RISPERIDONE 0.5 MG PO TABS
0.5000 mg | ORAL_TABLET | Freq: Every day | ORAL | Status: DC
  Administered 2015-03-04: 0.5 mg via ORAL
  Filled 2015-03-04: qty 1

## 2015-03-04 MED ORDER — ACETAMINOPHEN 650 MG RE SUPP: 650.0000 mg | Freq: Four times a day (QID) | RECTAL | Status: DC | PRN

## 2015-03-04 MED ORDER — ACETAMINOPHEN 325 MG PO TABS: 650.0000 mg | ORAL_TABLET | Freq: Four times a day (QID) | ORAL | Status: DC | PRN

## 2015-03-04 MED ORDER — ONDANSETRON HCL 4 MG/2ML IJ SOLN: 4.0000 mg | Freq: Four times a day (QID) | INTRAMUSCULAR | Status: DC | PRN

## 2015-03-04 MED ORDER — PANTOPRAZOLE SODIUM 40 MG PO TBEC
40.0000 mg | DELAYED_RELEASE_TABLET | Freq: Every day | ORAL | Status: DC
  Administered 2015-03-04 – 2015-03-05 (×2): 40 mg via ORAL
  Filled 2015-03-04 (×2): qty 1

## 2015-03-04 MED ORDER — INFLUENZA VAC SPLIT QUAD 0.5 ML IM SUSY
0.5000 mL | PREFILLED_SYRINGE | INTRAMUSCULAR | Status: AC
  Administered 2015-03-05: 0.5 mL via INTRAMUSCULAR
  Filled 2015-03-04 (×2): qty 0.5

## 2015-03-04 MED ORDER — RISPERIDONE 0.5 MG PO TABS
1.0000 mg | ORAL_TABLET | Freq: Two times a day (BID) | ORAL | Status: DC
  Administered 2015-03-04 – 2015-03-05 (×2): 1 mg via ORAL
  Filled 2015-03-04 (×4): qty 2

## 2015-03-04 MED ORDER — CALCIUM CARBONATE-VITAMIN D 500-200 MG-UNIT PO TABS
1.0000 | ORAL_TABLET | Freq: Every day | ORAL | Status: DC
  Administered 2015-03-04 – 2015-03-05 (×2): 1 via ORAL
  Filled 2015-03-04 (×3): qty 1

## 2015-03-04 MED ORDER — OXCARBAZEPINE 300 MG PO TABS
300.0000 mg | ORAL_TABLET | Freq: Two times a day (BID) | ORAL | Status: DC
  Administered 2015-03-04 – 2015-03-05 (×3): 300 mg via ORAL
  Filled 2015-03-04 (×4): qty 1

## 2015-03-04 MED ORDER — VANCOMYCIN HCL IN DEXTROSE 750-5 MG/150ML-% IV SOLN
750.0000 mg | Freq: Two times a day (BID) | INTRAVENOUS | Status: DC
  Administered 2015-03-04 – 2015-03-05 (×2): 750 mg via INTRAVENOUS
  Filled 2015-03-04 (×3): qty 150

## 2015-03-04 MED ORDER — ONDANSETRON HCL 4 MG PO TABS: 4.0000 mg | ORAL_TABLET | Freq: Four times a day (QID) | ORAL | Status: DC | PRN

## 2015-03-04 MED ORDER — SODIUM CHLORIDE 0.9 % IV SOLN
INTRAVENOUS | Status: AC
  Administered 2015-03-04: 07:00:00 via INTRAVENOUS

## 2015-03-04 NOTE — H&P (Signed)
[facility] History and Physical  Alan Rhodes NID:[id number] DOB: 06-28-1940 DOA: 03/03/2015  Referring physician: Ms. Kerrie Buffalo. PCP: Galvin Proffer, MD  Specialists: None.  Chief Complaint: Weakness.  History obtained from patient's home health aide.  HPI: Alan Rhodes is a 74 y.o. male with mental retardation, autism and seizure disorder and rheumatoid arthritis was brought to the ER after patient was found to be increasingly weak and not eating well and decreased urinary output since yesterday morning. In the ER patient had in and out cath for which patient had 300 mL urine. Chest x-ray shows right sided pneumonia. As per the patient's home health aide patient has chronic cough. Did not have any nausea vomiting or diarrhea or any fever or chills. At this time patient has been admitted for weakness probably secondary to pneumonia. Patient is not communicative and does not provide any history. On exam patient is not in distress.   Review of Systems: As presented in the history of presenting illness, rest negative.  Past Medical History  Diagnosis Date  . Autism   . PTSD (post-traumatic stress disorder)   . Bipolar disorder, unspecified   . Profound mental retardation   . Seizure disorder   . Rheumatoid arteritis   . Constipation   . Incontinence of bowel   . Bladder incontinence    Past Surgical History  Procedure Laterality Date  . No past surgeries     Social History:  reports that he has never smoked. He has never used smokeless tobacco. He reports that he does not drink alcohol or use illicit drugs. Where does patient live group home. Can patient participate in ADLs? No.  No Known Allergies  Family History:  Family History  Problem Relation Age of Onset  . Family history unknown: Yes      Prior to Admission medications   Medication Sig Start Date End Date Taking? Authorizing Provider  Calcium Carbonate-Vitamin D (CALCARB 600/D) 600-400 MG-UNIT per  tablet Take 1 tablet by mouth daily.    Yes Historical Provider, MD  divalproex (DEPAKOTE) 125 MG DR tablet Take 400-625 mg by mouth 2 (two) times daily. Take 4 tablets (500 mg) in the am & Take 5 tablets (625 mg) in the pm.   Yes Historical Provider, MD  folic acid (FOLVITE) 1 MG tablet Take 1 mg by mouth daily.   Yes Historical Provider, MD  Oxcarbazepine (TRILEPTAL) 300 MG tablet Take 300 mg by mouth 2 (two) times daily.   Yes Historical Provider, MD  oxybutynin (DITROPAN) 5 MG tablet Take 10 mg by mouth 3 (three) times daily. At 8 am, 2 pm & 8 pm.   Yes Historical Provider, MD  oxybutynin (DITROPAN) 5 MG tablet Take 10 mg by mouth daily. At 2 pm.   Yes Historical Provider, MD  pantoprazole (PROTONIX) 40 MG tablet Take 40 mg by mouth daily.   Yes Historical Provider, MD  polyethylene glycol powder (MIRALAX) powder Take 17 g by mouth daily.   Yes Historical Provider, MD  pravastatin (PRAVACHOL) 20 MG tablet Take 20 mg by mouth daily.   Yes Historical Provider, MD  predniSONE (DELTASONE) 5 MG tablet Take 5 mg by mouth daily with breakfast.   Yes Historical Provider, MD  risperiDONE (RISPERDAL) 0.5 MG tablet Take 0.5 mg by mouth daily. At 4 pm.   Yes Historical Provider, MD  risperiDONE (RISPERDAL) 1 MG tablet Take 1 mg by mouth 2 (two) times daily. At 8 am & 8 pm.   Yes  Historical Provider, MD  tamsulosin (FLOMAX) 0.4 MG CAPS capsule Take 0.4 mg by mouth at bedtime.   Yes Historical Provider, MD  divalproex (DEPAKOTE SPRINKLE) 125 MG capsule Take 8 capsules (1,000 mg total) by mouth at bedtime. 01/01/13   Vassie Loll, MD  divalproex (DEPAKOTE SPRINKLE) 125 MG capsule Take 4 capsules (500 mg total) by mouth daily. Patient not taking: Reported on 03/03/2015 01/01/13   Vassie Loll, MD  feeding supplement (ENSURE) PUDG Take 1 Container by mouth 3 (three) times daily between meals. Patient not taking: Reported on 03/03/2015 01/01/13   Vassie Loll, MD  pantoprazole (PROTONIX) 20 MG tablet Take 1 tablet  (20 mg total) by mouth daily. Patient not taking: Reported on 03/03/2015 12/26/12   [name], MD    Physical Exam: Filed Vitals:   03/03/15 1636 03/03/15 1906 03/03/15 2108  BP: 157/75 139/79 145/93  Pulse: 73 75 79  Temp: 97.6 F (36.4 C) 97.9 F (36.6 C)   TempSrc: Oral Oral   Resp: 18 18 18   SpO2: 97% 98% 100%     General:  Moderately built and nourished.  Eyes: Anicteric no pallor.  ENT: No discharge from the ears eyes nose and mouth.  Neck: No mass felt.  Cardiovascular: S1 and S2 heard.  Respiratory: No rhonchi or crepitations.  Abdomen: Soft nontender bowel sounds present.  Skin: No rash.  Musculoskeletal: No edema.  Psychiatric: Patient has history of mental retardation.  Neurologic: Patient has history of mental retarded patient and does not follow commands.  Labs on Admission:  Basic Metabolic Panel:  Recent Labs Lab 03/03/15 1651  NA 129*  K 4.2  CL 98*  CO2 23  GLUCOSE 143*  BUN 12  CREATININE 0.67  CALCIUM 9.5   Liver Function Tests: No results for input(s): AST, ALT, ALKPHOS, BILITOT, PROT, ALBUMIN in the last 168 hours. No results for input(s): LIPASE, AMYLASE in the last 168 hours. No results for input(s): AMMONIA in the last 168 hours. CBC:  Recent Labs Lab 03/03/15 1651  WBC 4.9  NEUTROABS 3.6  HGB 15.2  HCT 42.6  MCV 91.6  PLT 66*   Cardiac Enzymes: No results for input(s): CKTOTAL, CKMB, CKMBINDEX, TROPONINI in the last 168 hours.  BNP (last 3 results) No results for input(s): BNP in the last 8760 hours.  ProBNP (last 3 results) No results for input(s): PROBNP in the last 8760 hours.  CBG: No results for input(s): GLUCAP in the last 168 hours.  Radiological Exams on Admission: Dg Chest 2 View  03/03/2015   CLINICAL DATA:  Weakness and urinary retention  EXAM: CHEST  2 VIEW  COMPARISON:  January 28, 2015 and December 24, 2012  FINDINGS: There is consolidation in the right middle lobe. There is patchy atelectasis in  the left base. Lungs elsewhere clear. Heart size and pulmonary vascularity are normal. No adenopathy. There is anterior wedging of several lower thoracic and upper lumbar vertebral bodies, stable.  IMPRESSION: Right middle lobe consolidation. Atelectasis left base. Followup PA and lateral chest radiographs recommended in 3-4 weeks following trial of antibiotic therapy to ensure resolution and exclude underlying malignancy.   Electronically Signed   By: December 26, 2012 III M.D.   On: 03/03/2015 21:59     Assessment/Plan Principal Problem:   HCAP (healthcare-associated pneumonia) Active Problems:   MR (mental retardation), severe   Seizure disorder   Autism   Rheumatoid arthritis   1. Generalized weakness and poor oral intake - suspect probably secondary to pneumonia. Since  patient has had this opacity previously I have ordered CT chest. Patient has been placed on vancomycin and Fortaz for healthcare associated pneumonia. We will also check Depakote levels ammonia levels and Trileptal levels. 2. Seizures - continue Depakote and Trileptal. 3. History of rheumatoid arthritis - continue prednisone. 4. Mental retardation and autism.  I have reviewed patient's old charts and labs. Personally reviewed patient's chest x-ray.   DVT Prophylaxis Lovenox.  Code Status: Full code. This may be verified in the a.m. with patient's guardian.  Family Communication: No family available. Discussed with patient's home health aide.  Disposition Plan: Admit to inpatient.    KAKRAKANDY,ARSHAD N. [facility] Pager [phone].  If 7PM-7AM, please contact night-coverage [email or web address] Password [email or web address] 03/04/2015, 2:06 AM

## 2015-03-04 NOTE — ED Notes (Signed)
Hospitalist at pt bedside.

## 2015-03-04 NOTE — ED Notes (Signed)
Pt to CT

## 2015-03-04 NOTE — Evaluation (Signed)
Physical Therapy Evaluation Patient Details Name: URBAN NAVAL MRN: [id number] DOB: [birth year] Today's Date: 03/04/2015   History of Present Illness  74 yo male admmitted with Pna. hx of autism, mental retardation, Sz, RA, bipolar, bowel/bladder incontinenct, PTSD. Admitted from group home.   Clinical Impression  On eval pt required Min-Mod assist +2 for mobility-walked ~10 feet with 2 HHA. No caregivers present during session. Unable to determine pt's baseline level of mobility/assist needed. Recommend return to group home as long as facility can continue to provide care.     Follow Up Recommendations Supervision/Assistance - 24 hour (return to group home as long as facility feels they can manage care)    Equipment Recommendations  None recommended by PT    Recommendations for Other Services       Precautions / Restrictions Precautions Precautions: Fall Restrictions Weight Bearing Restrictions: No      Mobility  Bed Mobility Overal bed mobility: Needs Assistance Bed Mobility: Supine to Sit;Sit to Supine     Supine to sit: Mod assist;+2 for physical assistance;+2 for safety/equipment;HOB elevated Sit to supine: Min guard   General bed mobility comments: Increased assist to encourage participation and initiate task. Pt does not follow/respond to verbal commands  Transfers Overall transfer level: Needs assistance Equipment used: 2 person hand held assist Transfers: Sit to/from Stand Sit to Stand: Mod assist;+2 physical assistance;+2 safety/equipment         General transfer comment: Assist to rise, stabilize, control descent. Multimodal cueing for participation.  Ambulation/Gait Ambulation/Gait assistance: Min assist;+2 physical assistance;+2 safety/equipment Ambulation Distance (Feet): 10 Feet Assistive device: 2 person hand held assist Gait Pattern/deviations: Decreased stride length;Decreased step length - right;Decreased step length - left     General  Gait Details: Assist to stabilize. Pt takes very short steps. Ambulation in room on today.  Stairs            Wheelchair Mobility    Modified Rankin (Stroke Patients Only)       Balance Overall balance assessment: Needs assistance         Standing balance support: Bilateral upper extremity supported;During functional activity Standing balance-Leahy Scale: Poor                               Pertinent Vitals/Pain Pain Assessment: Faces Pain Score: 0-No pain Faces Pain Scale: No hurt    Home Living Family/patient expects to be discharged to:: Group home                      Prior Function Level of Independence: Needs assistance               Hand Dominance        Extremity/Trunk Assessment   Upper Extremity Assessment:  (RA deformities of bil hands)           Lower Extremity Assessment: Generalized weakness      Cervical / Trunk Assessment: Normal  Communication   Communication: Expressive difficulties;Receptive difficulties  Cognition Arousal/Alertness: Awake/alert Behavior During Therapy: WFL for tasks assessed/performed Overall Cognitive Status: History of cognitive impairments - at baseline                      General Comments      Exercises        Assessment/Plan    PT Assessment Patient needs continued PT services  PT Diagnosis Difficulty walking;Generalized weakness   PT  Problem List Decreased strength;Decreased activity tolerance;Decreased mobility;Decreased balance;Decreased cognition  PT Treatment Interventions Gait training;Functional mobility training;Therapeutic activities;Balance training;Therapeutic exercise;Patient/family education   PT Goals (Current goals can be found in the Care Plan section) Acute Rehab PT Goals Patient Stated Goal: none stated-pt unable PT Goal Formulation: Patient unable to participate in goal setting Time For Goal Achievement: 03/18/15 Potential to Achieve Goals:  Fair    Frequency Min 2X/week   Barriers to discharge        Co-evaluation               End of Session Equipment Utilized During Treatment: Gait belt Activity Tolerance: Patient tolerated treatment well Patient left: in bed;with bed alarm set           Time: 1502-1510 PT Time Calculation (min) (ACUTE ONLY): 8 min   Charges:   PT Evaluation $Initial PT Evaluation Tier I: 1 Procedure     PT G Codes:        Rebeca Alert, MPT Pager: [phone]

## 2015-03-04 NOTE — Evaluation (Signed)
Clinical/Bedside Swallow Evaluation Patient Details  Name: Alan Rhodes MRN: [id number] Date of Birth: 05-Feb-1941  Today's Date: 03/04/2015 Time: SLP Start Time (ACUTE ONLY): 1545 SLP Stop Time (ACUTE ONLY): 1610 SLP Time Calculation (min) (ACUTE ONLY): 25 min  Past Medical History:  Past Medical History  Diagnosis Date  . Autism   . PTSD (post-traumatic stress disorder)   . Bipolar disorder, unspecified   . Profound mental retardation   . Seizure disorder   . Rheumatoid arteritis   . Constipation   . Incontinence of bowel   . Bladder incontinence    Past Surgical History:  Past Surgical History  Procedure Laterality Date  . No past surgeries     HPI:  74 yo male adm to [facility] with AMS, diagnosed with HCAP.  PMH + for autism/MR,  seizure disorder who resides in Group home.  Swallow evaluation ordered.    Assessment / Plan / Recommendation Clinical Impression  Pt presents with oral dysphagia consistent with MR diagnosis including lingual thrusting and delayed oral transiting. No s/s of aspiration noted with all po provided - moist cracker, icecream, applesauce, water via straw.  Pt is good at pacing himself and opening mouth to indicate when ready for intake.  Recommend people feeding pt allow time for him to swallow due to his oral deficits.   Skilled therapy included determining swallow precautions that are helpful to migitate dysphagia.    Recommend continue diet with strict aspiraiton precautions - order softer foods due to pt's oral deficits and edentulous status.  Use of straws acceptable and may be easier to feed pt.   SlP to sign off, please reorder if indicated.      Aspiration Risk  Mild    Diet Recommendation Thin (regular/soft foods)   Medication Administration: Whole meds with puree Compensations: Slow rate;Small sips/bites;Other (Comment) (allow pt to indicate when ready for more)    Other  Recommendations Oral Care Recommendations: Oral care before and after  PO   Follow Up Recommendations    none   Frequency and Duration        Pertinent Vitals/Pain Afebrile, decreased      Swallow Study Prior Functional Status   see hhx    General Date of Onset: 03/04/15 Other Pertinent Information: 74 yo male adm to [facility] with AMS, diagnosed with HCAP.  PMH + for autism/MR,  seizure disorder who resides in Group home.  Swallow evaluation ordered.  Type of Study: Bedside swallow evaluation Diet Prior to this Study: Regular;Thin liquids Temperature Spikes Noted: No Respiratory Status: Room air History of Recent Intubation: No Behavior/Cognition: Alert;Cooperative;Pleasant mood Oral Cavity - Dentition: Edentulous Self-Feeding Abilities: Total assist Patient Positioning: Upright in bed Baseline Vocal Quality: Normal Volitional Cough: Cognitively unable to elicit Volitional Swallow: Unable to elicit    Oral/Motor/Sensory Function Overall Oral Motor/Sensory Function:  (generalized weakness, discoordination due to MR but no focal CN deficits of directions pt would follow)   Ice Chips Ice chips: Not tested   Thin Liquid Thin Liquid: Within functional limits Presentation: Straw    Nectar Thick Nectar Thick Liquid: Not tested   Honey Thick Honey Thick Liquid: Not tested   Puree Puree: Impaired Presentation: Spoon Oral Phase Impairments: Impaired anterior to posterior transit;Reduced lingual movement/coordination Oral Phase Functional Implications: Prolonged oral transit   Solid   GO    Solid: Impaired Oral Phase Impairments: Impaired mastication;Impaired anterior to posterior transit;Reduced lingual movement/coordination Oral Phase Functional Implications: Other (comment) (delayed transiting)  Luanna Salk, [facility] SLP [phone]

## 2015-03-04 NOTE — Progress Notes (Signed)
PATIENT DETAILS Name: Alan Rhodes Age: 74 y.o. Sex: male Date of Birth: Aug 02, 1940 Admit Date: 03/03/2015 Admitting Physician Eduard Clos, MD UKG:[id number], Myrene Galas, MD  Brief narrative:  74 year old male with autism/mental retardation, seizure disorder admitted with generalized weakness. Found to have pneumonia.   Subjective: Awake-follows only some commands. Repeats my words. Not in any distress. Appears comfortable.  Assessment/Plan: Principal Problem: HCAP (healthcare-associated pneumonia): Improved-afebrile, no leukocytosis. Await cultures/urine studies. Continue empiric antibiotics. SLP evaluation ordered. Will need to repeat chest x-ray/CT chest in 4-6 weeks.  Active Problems: Seizure disorder: Stable, continue with Depakote and Trileptal. Depakote level within normal limits.  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 RN to call group home-to see if they can send staff to the hospital that the patient is familiar with. Continue risperidone  Rheumatoid arthritis: Chronic issue-continue with prednisone  GERD: PPI  Dyslipidemia: Continue statin  BPH: Continue Flomax  Disposition: Remain inpatient  Antimicrobial agents  See below  Anti-infectives    Start     Dose/Rate Route Frequency Ordered Stop   03/04/15 1200  vancomycin (VANCOCIN) IVPB 750 mg/150 ml premix     750 mg 150 mL/hr over 60 Minutes Intravenous Every 12 hours 03/04/15 0627     03/04/15 0600  cefTAZidime (FORTAZ) 2 g in dextrose 5 % 50 mL IVPB  Status:  Discontinued     2 g 100 mL/hr over 30 Minutes Intravenous 3 times per day 03/04/15 0341 03/04/15 0343   03/03/15 2245  vancomycin (VANCOCIN) IVPB 1000 mg/200 mL premix     1,000 mg 200 mL/hr over 60 Minutes Intravenous  Once 03/03/15 2231 03/04/15 0213   03/03/15 2230  cefTAZidime (FORTAZ) 2 g in dextrose 5 % 50 mL IVPB     2 g 100 mL/hr over 30 Minutes Intravenous 3 times per day  03/03/15 2226        DVT Prophylaxis: Prophylactic Lovenox   Code Status: Full code   Family Communication None at bedside  Procedures: None  CONSULTS:  None  Time spent 25 minutes-Greater than 50% of this time was spent in counseling, explanation of diagnosis, planning of further management, and coordination of care.  MEDICATIONS: Scheduled Meds: . calcium-vitamin D  1 tablet Oral Daily  . cefTAZidime (FORTAZ)  IV  2 g Intravenous 3 times per day  . divalproex  500 mg Oral Daily  . divalproex  625 mg Oral QHS  . enoxaparin (LOVENOX) injection  40 mg Subcutaneous Q24H  . folic acid  1 mg Oral Daily  . Oxcarbazepine  300 mg Oral BID  . oxybutynin  10 mg Oral 3 times per day  . pantoprazole  40 mg Oral Daily  . polyethylene glycol  17 g Oral Daily  . pravastatin  20 mg Oral q1800  . predniSONE  5 mg Oral Q breakfast  . risperiDONE  0.5 mg Oral Daily  . risperiDONE  1 mg Oral BID  . tamsulosin  0.4 mg Oral QHS  . vancomycin  750 mg Intravenous Q12H   Continuous Infusions: . sodium chloride 75 mL/hr at 03/04/15 0704   PRN Meds:.acetaminophen **OR** acetaminophen, ondansetron **OR** ondansetron (ZOFRAN) IV    PHYSICAL EXAM: Vital signs in last 24 hours: Filed Vitals:   03/03/15 1906 03/03/15 2108 03/04/15 0400 03/04/15 0530  BP: 139/79 145/93  156/75  Pulse: 75 79  64  Temp: 97.9 F (  36.6 C)   98.1 F (36.7 C)  TempSrc: Oral   Oral  Resp: 18 18  18   Height:   5\' 2"  (1.575 m) 5\' 2"  (1.575 m)  Weight:   68.04 kg (150 lb) 67.087 kg (147 lb 14.4 oz)  SpO2: 98% 100%  100%    Weight change:  Filed Weights   03/04/15 0400 03/04/15 0530  Weight: 68.04 kg (150 lb) 67.087 kg (147 lb 14.4 oz)   Body mass index is 27.04 kg/(m^2).   Gen Exam: Awake-follows some commands, not in any distress. Neck: Supple, No JVD.   Chest: B/L Clear.   CVS: S1 S2 Regular, no murmurs.  Abdomen: soft, BS +, non tender, non distended.  Extremities: no edema, lower extremities  warm to touch. Neurologic: Difficult exam-but appears Non Focal.   Skin: No Rash.   Wounds: N/A.   Intake/Output from previous day:  Intake/Output Summary (Last 24 hours) at 03/04/15 1207 Last data filed at 03/03/15 1907  Gross per 24 hour  Intake      0 ml  Output    350 ml  Net   -350 ml     LAB RESULTS: CBC  Recent Labs Lab 03/03/15 1651 03/04/15 0654  WBC 4.9 5.9  HGB 15.2 13.8  HCT 42.6 41.1  PLT 66* 73*  MCV 91.6 94.1  MCH 32.7 31.6  MCHC 35.7 33.6  RDW 15.3 15.3  LYMPHSABS 0.9 1.7  MONOABS 0.4 0.9  EOSABS 0.0 0.0  BASOSABS 0.0 0.0    Chemistries   Recent Labs Lab 03/03/15 1651 03/04/15 0654  NA 129* 138  K 4.2 4.2  CL 98* 107  CO2 23 25  GLUCOSE 143* 84  BUN 12 8  CREATININE 0.67 0.68  CALCIUM 9.5 9.2    CBG: No results for input(s): GLUCAP in the last 168 hours.  GFR Estimated Creatinine Clearance: 70.4 mL/min (by C-G formula based on Cr of 0.68).  Coagulation profile No results for input(s): INR, PROTIME in the last 168 hours.  Cardiac Enzymes No results for input(s): CKMB, TROPONINI, MYOGLOBIN in the last 168 hours.  Invalid input(s): CK  Invalid input(s): POCBNP No results for input(s): DDIMER in the last 72 hours. No results for input(s): HGBA1C in the last 72 hours. No results for input(s): CHOL, HDL, LDLCALC, TRIG, CHOLHDL, LDLDIRECT in the last 72 hours. No results for input(s): TSH, T4TOTAL, T3FREE, THYROIDAB in the last 72 hours.  Invalid input(s): FREET3 No results for input(s): VITAMINB12, FOLATE, FERRITIN, TIBC, IRON, RETICCTPCT in the last 72 hours. No results for input(s): LIPASE, AMYLASE in the last 72 hours.  Urine Studies No results for input(s): UHGB, CRYS in the last 72 hours.  Invalid input(s): UACOL, UAPR, USPG, UPH, UTP, UGL, UKET, UBIL, UNIT, UROB, ULEU, UEPI, UWBC, URBC, UBAC, CAST, UCOM, BILUA  MICROBIOLOGY: No results found for this or any previous visit (from the past 240 hour(s)).  RADIOLOGY  STUDIES/RESULTS: Dg Chest 2 View  03/03/2015   CLINICAL DATA:  Weakness and urinary retention  EXAM: CHEST  2 VIEW  COMPARISON:  January 28, 2015 and December 24, 2012  FINDINGS: There is consolidation in the right middle lobe. There is patchy atelectasis in the left base. Lungs elsewhere clear. Heart size and pulmonary vascularity are normal. No adenopathy. There is anterior wedging of several lower thoracic and upper lumbar vertebral bodies, stable.  IMPRESSION: Right middle lobe consolidation. Atelectasis left base. Followup PA and lateral chest radiographs recommended in 3-4 weeks following trial of  antibiotic therapy to ensure resolution and exclude underlying malignancy.   Electronically Signed   By: Bretta Bang III M.D.   On: 03/03/2015 21:59   Ct Head Wo Contrast  03/04/2015   CLINICAL DATA:  74 year old male with weakness and lethargy  EXAM: CT HEAD WITHOUT CONTRAST  TECHNIQUE: Contiguous axial images were obtained from the base of the skull through the vertex without intravenous contrast.  COMPARISON:  CT dated 01/27/2015  FINDINGS: The ventricles are dilated and the sulci are prominent compatible with age-related atrophy. Periventricular and deep white matter hypodensities represent chronic microvascular ischemic changes. There is no intracranial hemorrhage. No mass effect or midline shift identified.  The visualized paranasal sinuses and mastoid air cells are well aerated. The calvarium is intact.  IMPRESSION: No acute intracranial pathology.  Age-related atrophy and chronic microvascular ischemic disease.  If symptoms persist and there are no contraindications, MRI may provide better evaluation if clinically indicated.   Electronically Signed   By: Elgie Collard M.D.   On: 03/04/2015 03:41   Ct Chest Wo Contrast  03/04/2015   CLINICAL DATA:  Lung mass.  EXAM: CT CHEST WITHOUT CONTRAST  TECHNIQUE: Multidetector CT imaging of the chest was performed following the standard protocol without IV  contrast.  COMPARISON:  Chest radiograph 1 day prior.  Chest CT 12/08/2012  FINDINGS: The previous heterogeneous masslike lesion containing fat in the right middle lobe has diminished in size, currently measuring 3.2x 2.2 cm. There is complete right middle lobe atelectasis with minimal air bronchograms centrally.  Fat density lesions in both lower lobes, 17 mm on the left abutting the diaphragm, 13 mm on the right posteriorly. These are unchanged when compared to prior exam.  Evaluation of the lung bases limited by patient motion artifact. There is linear atelectasis in the right lower lobe. Small tree in nodular opacities in the superior segment and posterior left lower lobe, unchanged from prior exam. Calcified granuloma in the left lower lobe.  There is no pleural or pericardial effusion. The heart size is normal. Coronary artery calcifications are seen. Tortuosity of thoracic aorta with mild atherosclerosis. No definite mediastinal or hilar adenopathy.  There is a moderate hiatal hernia. No acute abnormality in the included upper abdomen.  There are no acute or suspicious osseous abnormalities. Lower thoracic compression fractures, unchanged.  IMPRESSION: 1. The previous heterogeneous masslike lesion in the right middle lobe has diminished in size compared to prior, there is complete atelectasis of the right middle lobe. In addition there are fat density nodules in the lower lobes bilaterally. Findings may reflect sequela of lipid pneumonia. No suspicious lung mass. 2. Chronic small nodular opacities in the left lower lobe, unchanged from prior exam. 3. No new abnormality is seen.   Electronically Signed   By: Rubye Oaks M.D.   On: 03/04/2015 03:52    Jeoffrey Massed, MD  [facility] Pager:[phone]  If 7PM-7AM, please contact night-coverage [email or web address] Password [email or web address] 03/04/2015, 12:07 PM   LOS: 1 day

## 2015-03-04 NOTE — Clinical Social Work Note (Signed)
Clinical Social Work Assessment  Patient Details  Name: Alan Rhodes MRN: [id number] Date of Birth: 1940-11-20  Date of referral:  03/04/15               Reason for consult:  Facility Placement                Permission sought to share information with:  Facility Industrial/product designer granted to share information::  Yes, Verbal Permission Granted  Name::        Agency::     Relationship::     Contact Information:     Housing/Transportation Living arrangements for the past 2 months:  Group Home Source of Information:   (facility contact, Wynelle Bourgeois ) Patient Interpreter Needed:  None Criminal Activity/Legal Involvement Pertinent to Current Situation/Hospitalization:  No - Comment as needed Significant Relationships:   (guardian, Engineer, maintenance, Hilda Lias) Lives with:  Facility Resident Do you feel safe going back to the place where you live?  Yes Need for family participation in patient care:  Yes (Comment)  Care giving concerns:  CSW received consult that patient was admitted from [facility] (ph#: [phone]).    Social Worker assessment / plan:  CSW confirmed with Hilda Lias at [facility] that they should be able to take patient back when stable for discharge, awaiting PT evaluation.   Employment status:    Health and safety inspector:  Medicare, Medicaid In [place] PT Recommendations:  Not assessed at this time Information / Referral to community resources:     Patient/Family's Response to care:  CSW left voicemail for patient's guardian through [facility] DSS, Lajuana Ripple (ph#: [phone]) but she is out of the office until Tues, 03/07/15 - & left voicemail for backup guardian, Ander Slade (ph#: [phone]).   Patient/Family's Understanding of and Emotional Response to Diagnosis, Current Treatment, and Prognosis:  CSW confirmed patient's baseline at group was 1 person assist with all ADL's, wore diapers and went to a day program. He had been  living at the group home for the past 3 or 4 years per Byrd Hesselbach at [facility].   Emotional Assessment Appearance:    Attitude/Demeanor/Rapport:    Affect (typically observed):  Pleasant, Calm Orientation:  Oriented to Self Alcohol / Substance use:    Psych involvement (Current and /or in the community):     Discharge Needs  Concerns to be addressed:    Readmission within the last 30 days:    Current discharge risk:    Barriers to Discharge:      Arlyss Repress, LCSW 03/04/2015, 3:29 PM

## 2015-03-04 NOTE — Progress Notes (Signed)
ANTIBIOTIC CONSULT NOTE - INITIAL  Pharmacy Consult for Vancomycin Indication: PNA  No Known Allergies  Patient Measurements: Height: 5\' 2"  (157.5 cm) Weight: 147 lb 14.4 oz (67.087 kg) IBW/kg (Calculated) : 54.6 Adjusted Body Weight:   Vital Signs: Temp: 98.1 F (36.7 C) (09/16 0530) Temp Source: Oral (09/16 0530) BP: 156/75 mmHg (09/16 0530) Pulse Rate: 64 (09/16 0530) Intake/Output from previous day: 09/15 0701 - 09/16 0700 In: -  Out: 350 [Urine:350] Intake/Output from this shift: Total I/O In: -  Out: 350 [Urine:350]  Labs:  Recent Labs  03/03/15 1651  WBC 4.9  HGB 15.2  PLT 66*  CREATININE 0.67   Estimated Creatinine Clearance: 70.4 mL/min (by C-G formula based on Cr of 0.67). No results for input(s): VANCOTROUGH, VANCOPEAK, VANCORANDOM, GENTTROUGH, GENTPEAK, GENTRANDOM, TOBRATROUGH, TOBRAPEAK, TOBRARND, AMIKACINPEAK, AMIKACINTROU, AMIKACIN in the last 72 hours.   Microbiology: No results found for this or any previous visit (from the past 720 hour(s)).  Medical History: Past Medical History  Diagnosis Date  . Autism   . PTSD (post-traumatic stress disorder)   . Bipolar disorder, unspecified   . Profound mental retardation   . Seizure disorder   . Rheumatoid arteritis   . Constipation   . Incontinence of bowel   . Bladder incontinence     Medications:  Anti-infectives    Start     Dose/Rate Route Frequency Ordered Stop   03/04/15 1200  vancomycin (VANCOCIN) IVPB 750 mg/150 ml premix     750 mg 150 mL/hr over 60 Minutes Intravenous Every 12 hours 03/04/15 0627     03/04/15 0600  cefTAZidime (FORTAZ) 2 g in dextrose 5 % 50 mL IVPB  Status:  Discontinued     2 g 100 mL/hr over 30 Minutes Intravenous 3 times per day 03/04/15 0341 03/04/15 0343   03/03/15 2245  vancomycin (VANCOCIN) IVPB 1000 mg/200 mL premix     1,000 mg 200 mL/hr over 60 Minutes Intravenous  Once 03/03/15 2231 03/04/15 0213   03/03/15 2230  cefTAZidime (FORTAZ) 2 g in dextrose  5 % 50 mL IVPB     2 g 100 mL/hr over 30 Minutes Intravenous 3 times per day 03/03/15 2226       Assessment: Patient with PNA.  First dose of antibiotics already given in ED.    Goal of Therapy:  Vancomycin trough level 15-20 mcg/ml  Plan:  Measure antibiotic drug levels at steady state Follow up culture results Vancomycin 750mg  iv q12hr  [address] [place] 03/04/2015,6:28 AM

## 2015-03-05 ENCOUNTER — Encounter (HOSPITAL_COMMUNITY): Payer: Self-pay | Admitting: Student

## 2015-03-05 DIAGNOSIS — J189 Pneumonia, unspecified organism: Principal | ICD-10-CM

## 2015-03-05 LAB — STREP PNEUMONIAE URINARY ANTIGEN: STREP PNEUMO URINARY ANTIGEN: NEGATIVE

## 2015-03-05 MED ORDER — LEVOFLOXACIN 750 MG PO TABS: 750.0000 mg | ORAL_TABLET | Freq: Every day | ORAL | Status: DC

## 2015-03-05 NOTE — Discharge Summary (Signed)
Physician Discharge Summary  Alan Rhodes MRN: [id number] DOB/AGE: [birth year] 74 y.o.  PCP: Bonnita Nasuti, MD   Admit date: 03/03/2015 Discharge date: 03/05/2015  Discharge Diagnoses:   Principal Problem:   HCAP (healthcare-associated pneumonia) Active Problems:   MR (mental retardation), severe   Seizure disorder   Autism   Rheumatoid arthritis    Follow-up recommendations Follow-up with PCP in 3-5 days , including all  additional recommended appointments as below Follow-up CBC, CMP in 3-5 days      Medication List    TAKE these medications        CALCARB 600/D 600-400 MG-UNIT per tablet  Generic drug:  Calcium Carbonate-Vitamin D  Take 1 tablet by mouth daily.     divalproex 125 MG capsule  Commonly known as:  DEPAKOTE SPRINKLE  Take 8 capsules (1,000 mg total) by mouth at bedtime.     divalproex 125 MG capsule  Commonly known as:  DEPAKOTE SPRINKLE  Take 4 capsules (500 mg total) by mouth daily.     divalproex 125 MG DR tablet  Commonly known as:  DEPAKOTE  Take 500-625 mg by mouth 2 (two) times daily. Take 4 tablets (500 mg) in the am & Take 5 tablets (625 mg) in the pm.     feeding supplement (ENSURE) Pudg  Take 1 Container by mouth 3 (three) times daily between meals.     folic acid 1 MG tablet  Commonly known as:  FOLVITE  Take 1 mg by mouth daily.     levofloxacin 750 MG tablet  Commonly known as:  LEVAQUIN  Take 1 tablet (750 mg total) by mouth daily.     MIRALAX powder  Generic drug:  polyethylene glycol powder  Take 17 g by mouth daily.     Oxcarbazepine 300 MG tablet  Commonly known as:  TRILEPTAL  Take 300 mg by mouth 2 (two) times daily.     oxybutynin 5 MG tablet  Commonly known as:  DITROPAN  Take 10 mg by mouth 3 (three) times daily. At 8 am, 2 pm & 8 pm.     oxybutynin 5 MG tablet  Commonly known as:  DITROPAN  Take 10 mg by mouth daily. At 2 pm.     pantoprazole 20 MG tablet  Commonly known as:  PROTONIX  Take 1  tablet (20 mg total) by mouth daily.     pantoprazole 40 MG tablet  Commonly known as:  PROTONIX  Take 40 mg by mouth daily.     pravastatin 20 MG tablet  Commonly known as:  PRAVACHOL  Take 20 mg by mouth daily.     predniSONE 5 MG tablet  Commonly known as:  DELTASONE  Take 5 mg by mouth daily with breakfast.     risperiDONE 0.5 MG tablet  Commonly known as:  RISPERDAL  Take 0.5 mg by mouth daily. At 4 pm.     risperiDONE 1 MG tablet  Commonly known as:  RISPERDAL  Take 1 mg by mouth 2 (two) times daily. At 8 am & 8 pm.     tamsulosin 0.4 MG Caps capsule  Commonly known as:  FLOMAX  Take 0.4 mg by mouth at bedtime.         Discharge Condition: *Stable  Disposition: Group home   Consults: *None  Significant Diagnostic Studies:  Dg Chest 2 View  03/03/2015   CLINICAL DATA:  Weakness and urinary retention  EXAM: CHEST  2 VIEW  COMPARISON:  January 28, 2015 and December 24, 2012  FINDINGS: There is consolidation in the right middle lobe. There is patchy atelectasis in the left base. Lungs elsewhere clear. Heart size and pulmonary vascularity are normal. No adenopathy. There is anterior wedging of several lower thoracic and upper lumbar vertebral bodies, stable.  IMPRESSION: Right middle lobe consolidation. Atelectasis left base. Followup PA and lateral chest radiographs recommended in 3-4 weeks following trial of antibiotic therapy to ensure resolution and exclude underlying malignancy.   Electronically Signed   By: Lowella Grip III M.D.   On: 03/03/2015 21:59   Ct Head Wo Contrast  03/04/2015   CLINICAL DATA:  74 year old male with weakness and lethargy  EXAM: CT HEAD WITHOUT CONTRAST  TECHNIQUE: Contiguous axial images were obtained from the base of the skull through the vertex without intravenous contrast.  COMPARISON:  CT dated 01/27/2015  FINDINGS: The ventricles are dilated and the sulci are prominent compatible with age-related atrophy. Periventricular and deep white  matter hypodensities represent chronic microvascular ischemic changes. There is no intracranial hemorrhage. No mass effect or midline shift identified.  The visualized paranasal sinuses and mastoid air cells are well aerated. The calvarium is intact.  IMPRESSION: No acute intracranial pathology.  Age-related atrophy and chronic microvascular ischemic disease.  If symptoms persist and there are no contraindications, MRI may provide better evaluation if clinically indicated.   Electronically Signed   By: Anner Crete M.D.   On: 03/04/2015 03:41   Ct Chest Wo Contrast  03/04/2015   CLINICAL DATA:  Lung mass.  EXAM: CT CHEST WITHOUT CONTRAST  TECHNIQUE: Multidetector CT imaging of the chest was performed following the standard protocol without IV contrast.  COMPARISON:  Chest radiograph 1 day prior.  Chest CT 12/08/2012  FINDINGS: The previous heterogeneous masslike lesion containing fat in the right middle lobe has diminished in size, currently measuring 3.2x 2.2 cm. There is complete right middle lobe atelectasis with minimal air bronchograms centrally.  Fat density lesions in both lower lobes, 17 mm on the left abutting the diaphragm, 13 mm on the right posteriorly. These are unchanged when compared to prior exam.  Evaluation of the lung bases limited by patient motion artifact. There is linear atelectasis in the right lower lobe. Small tree in nodular opacities in the superior segment and posterior left lower lobe, unchanged from prior exam. Calcified granuloma in the left lower lobe.  There is no pleural or pericardial effusion. The heart size is normal. Coronary artery calcifications are seen. Tortuosity of thoracic aorta with mild atherosclerosis. No definite mediastinal or hilar adenopathy.  There is a moderate hiatal hernia. No acute abnormality in the included upper abdomen.  There are no acute or suspicious osseous abnormalities. Lower thoracic compression fractures, unchanged.  IMPRESSION: 1. The  previous heterogeneous masslike lesion in the right middle lobe has diminished in size compared to prior, there is complete atelectasis of the right middle lobe. In addition there are fat density nodules in the lower lobes bilaterally. Findings may reflect sequela of lipid pneumonia. No suspicious lung mass. 2. Chronic small nodular opacities in the left lower lobe, unchanged from prior exam. 3. No new abnormality is seen.   Electronically Signed   By: Jeb Levering M.D.   On: 03/04/2015 03:52      Filed Weights   03/04/15 0400 03/04/15 0530  Weight: 68.04 kg (150 lb) 67.087 kg (147 lb 14.4 oz)     Microbiology: No results found for this or any previous visit (from the past  240 hour(s)).     Blood Culture    Component Value Date/Time   SDES URINE, RANDOM 12/25/2012 0556   SPECREQUEST NONE 12/25/2012 0556   CULT NO GROWTH 12/24/2012 1309   REPTSTATUS 12/26/2012 FINAL 12/25/2012 0556      Labs: Results for orders placed or performed during the hospital encounter of 03/03/15 (from the past 48 hour(s))  CBC with Differential     Status: Abnormal   Collection Time: 03/03/15  4:51 PM  Result Value Ref Range   WBC 4.9 4.0 - 10.5 K/uL   RBC 4.65 4.22 - 5.81 MIL/uL   Hemoglobin 15.2 13.0 - 17.0 g/dL   HCT 42.6 39.0 - 52.0 %   MCV 91.6 78.0 - 100.0 fL   MCH 32.7 26.0 - 34.0 pg   MCHC 35.7 30.0 - 36.0 g/dL   RDW 15.3 11.5 - 15.5 %   Platelets 66 (L) 150 - 400 K/uL    Comment: REPEATED TO VERIFY SPECIMEN CHECKED FOR CLOTS PLATELET COUNT CONFIRMED BY SMEAR    Neutrophils Relative % 73 %   Lymphocytes Relative 18 %   Monocytes Relative 9 %   Eosinophils Relative 0 %   Basophils Relative 0 %   Neutro Abs 3.6 1.7 - 7.7 K/uL   Lymphs Abs 0.9 0.7 - 4.0 K/uL   Monocytes Absolute 0.4 0.1 - 1.0 K/uL   Eosinophils Absolute 0.0 0.0 - 0.7 K/uL   Basophils Absolute 0.0 0.0 - 0.1 K/uL   Smear Review MORPHOLOGY UNREMARKABLE   Basic metabolic panel     Status: Abnormal   Collection  Time: 03/03/15  4:51 PM  Result Value Ref Range   Sodium 129 (L) 135 - 145 mmol/L   Potassium 4.2 3.5 - 5.1 mmol/L   Chloride 98 (L) 101 - 111 mmol/L   CO2 23 22 - 32 mmol/L   Glucose, Bld 143 (H) 65 - 99 mg/dL   BUN 12 6 - 20 mg/dL   Creatinine, Ser 0.67 0.61 - 1.24 mg/dL   Calcium 9.5 8.9 - 10.3 mg/dL   GFR calc non Af Amer >60 >60 mL/min   GFR calc Af Amer >60 >60 mL/min    Comment: (NOTE) The eGFR has been calculated using the CKD EPI equation. This calculation has not been validated in all clinical situations. eGFR's persistently <60 mL/min signify possible Chronic Kidney Disease.    Anion gap 8 5 - 15  Urinalysis, Routine w reflex microscopic (not at [facility])     Status: None   Collection Time: 03/03/15  6:53 PM  Result Value Ref Range   Color, Urine YELLOW YELLOW   APPearance CLEAR CLEAR   Specific Gravity, Urine 1.022 1.005 - 1.030   pH 6.5 5.0 - 8.0   Glucose, UA NEGATIVE NEGATIVE mg/dL   Hgb urine dipstick NEGATIVE NEGATIVE   Bilirubin Urine NEGATIVE NEGATIVE   Ketones, ur NEGATIVE NEGATIVE mg/dL   Protein, ur NEGATIVE NEGATIVE mg/dL   Urobilinogen, UA 1.0 0.0 - 1.0 mg/dL   Nitrite NEGATIVE NEGATIVE   Leukocytes, UA NEGATIVE NEGATIVE    Comment: MICROSCOPIC NOT DONE ON URINES WITH NEGATIVE PROTEIN, BLOOD, LEUKOCYTES, NITRITE, OR GLUCOSE <1000 mg/dL.  I-Stat CG4 Lactic Acid, ED     Status: None   Collection Time: 03/03/15 10:14 PM  Result Value Ref Range   Lactic Acid, Venous 0.98 0.5 - 2.0 mmol/L  I-Stat CG4 Lactic Acid, ED     Status: None   Collection Time: 03/04/15  1:28 AM  Result Value  Ref Range   Lactic Acid, Venous 1.11 0.5 - 2.0 mmol/L  Valproic acid level     Status: None   Collection Time: 03/04/15  2:36 AM  Result Value Ref Range   Valproic Acid Lvl 70 50.0 - 100.0 ug/mL  Ammonia     Status: Abnormal   Collection Time: 03/04/15  2:36 AM  Result Value Ref Range   Ammonia 37 (H) 9 - 35 umol/L  HIV antibody     Status: None   Collection Time:  03/04/15  6:54 AM  Result Value Ref Range   HIV Screen 4th Generation wRfx Non Reactive Non Reactive    Comment: (NOTE) Performed At: [facility] [place], [place] [id number] Lindon Romp MD LA:[id number]   Comprehensive metabolic panel     Status: Abnormal   Collection Time: 03/04/15  6:54 AM  Result Value Ref Range   Sodium 138 135 - 145 mmol/L    Comment: DELTA CHECK NOTED REPEATED TO VERIFY    Potassium 4.2 3.5 - 5.1 mmol/L   Chloride 107 101 - 111 mmol/L   CO2 25 22 - 32 mmol/L   Glucose, Bld 84 65 - 99 mg/dL   BUN 8 6 - 20 mg/dL   Creatinine, Ser 0.68 0.61 - 1.24 mg/dL   Calcium 9.2 8.9 - 10.3 mg/dL   Total Protein 5.8 (L) 6.5 - 8.1 g/dL   Albumin 3.0 (L) 3.5 - 5.0 g/dL   AST 20 15 - 41 U/L   ALT 9 (L) 17 - 63 U/L   Alkaline Phosphatase 54 38 - 126 U/L   Total Bilirubin 0.8 0.3 - 1.2 mg/dL   GFR calc non Af Amer >60 >60 mL/min   GFR calc Af Amer >60 >60 mL/min    Comment: (NOTE) The eGFR has been calculated using the CKD EPI equation. This calculation has not been validated in all clinical situations. eGFR's persistently <60 mL/min signify possible Chronic Kidney Disease.    Anion gap 6 5 - 15  CBC WITH DIFFERENTIAL     Status: Abnormal   Collection Time: 03/04/15  6:54 AM  Result Value Ref Range   WBC 5.9 4.0 - 10.5 K/uL   RBC 4.37 4.22 - 5.81 MIL/uL   Hemoglobin 13.8 13.0 - 17.0 g/dL   HCT 41.1 39.0 - 52.0 %   MCV 94.1 78.0 - 100.0 fL   MCH 31.6 26.0 - 34.0 pg   MCHC 33.6 30.0 - 36.0 g/dL   RDW 15.3 11.5 - 15.5 %   Platelets 73 (L) 150 - 400 K/uL    Comment: CONSISTENT WITH PREVIOUS RESULT   Neutrophils Relative % 56 %   Neutro Abs 3.3 1.7 - 7.7 K/uL   Lymphocytes Relative 29 %   Lymphs Abs 1.7 0.7 - 4.0 K/uL   Monocytes Relative 15 %   Monocytes Absolute 0.9 0.1 - 1.0 K/uL   Eosinophils Relative 1 %   Eosinophils Absolute 0.0 0.0 - 0.7 K/uL   Basophils Relative 0 %   Basophils Absolute 0.0 0.0 - 0.1 K/uL     Lipid  Panel  No results found for: CHOL, TRIG, HDL, CHOLHDL, VLDL, LDLCALC, LDLDIRECT   No results found for: HGBA1C   Lab Results  Component Value Date   CREATININE 0.68 03/04/2015     HPI :*Alan Rhodes is a 74 y.o. male with mental retardation, autism and seizure disorder and rheumatoid arthritis was brought to the ER after patient was found to be  increasingly weak and not eating well and decreased urinary output since yesterday morning. In the ER patient had in and out cath for which patient had 300 mL urine. Chest x-ray shows right sided pneumonia. As per the patient's home health aide patient has chronic cough. Did not have any nausea vomiting or diarrhea or any fever or chills. At this time patient has been admitted for weakness probably secondary to pneumonia. Patient is not communicative and does not provide any history. On exam patient is not in distress.    HOSPITAL COURSE:  1. Generalized weakness and poor oral intake - suspect probably secondary to pneumonia. Patient was initiated on treatment with Fortaz and vancomycin, afebrile overnight, normal white count, switched to by mouth Levaquin for 5 more days. CT scan suggestive of right middle lobe pneumonia. HIV nonreactive, patient is clinically improved and is being discharged to group home today 2. Seizures - continue Depakote and Trileptal. Valproic acid level therapeutic at 70 3. History of rheumatoid arthritis - continue prednisone. 4. Mental retardation and autism.    Discharge Exam: Blood pressure 130/70, pulse 62, temperature 98.2 F (36.8 C), temperature source Oral, resp. rate 18, height _0  (1.575 m), weight 67.087 kg (147 lb 14.4 oz), SpO2 95 %.   5. General: Moderately built and nourished. 6. Eyes: Anicteric no pallor. 7. ENT: No discharge from the ears eyes nose and mouth. 8. Neck: No mass felt. 9. Cardiovascular: S1 and S2 heard. 10. Respiratory: No rhonchi or crepitations. 11. Abdomen: Soft nontender  bowel sounds present. 12. Skin: No rash. 13. Musculoskeletal: No edema. 14. Psychiatric: Patient has history of mental retardation. 15. Neurologic: Patient has history of mental retarded patient and does not follow commands. 16.       Discharge Instructions    Diet - low sodium heart healthy    Complete by:  As directed      Increase activity slowly    Complete by:  As directed            Follow-up Information    Follow up with HAGUE, Rosalyn Charters, MD. Schedule an appointment as soon as possible for a visit in 3 days.   Specialty:  Internal Medicine   Contact information:   [address] [place]  [address] [phone]       Signed: Reyne Dumas 03/05/2015, 11:45 AM        Time spent >45 mins

## 2015-03-05 NOTE — Progress Notes (Addendum)
Reviewed discharge information with patient and caregiver. Answered all questions. Patient/caregiver able to teach back medications and reasons to contact MD/911. Patient verbalizes importance of PCP follow up appointment. Social worker gave information for group home  [place]. Clelia Croft, RN

## 2015-03-05 NOTE — Clinical Social Work Note (Signed)
CSW received call that pt was ready for discharge.    CSW called pt;s group home to advise and facilitate discharge  CSW prepared packet and provided it to RN  Pt's group home sitter "Danella Deis" will transport pt back to group home per group home director  .Elray Buba, LCSW [facility] Clinical Social Worker - Weekend Coverage cell #: [phone]

## 2015-03-05 NOTE — Progress Notes (Signed)
Attempted to make a follow up appointment per MD orders, however, patient's PCP office is not open on Saturday. Unable to leave a message with office. Will instruct patient's caregiver to make sure an appointment is scheduled once the office reopens on Monday.   Arta Bruce [facility] 03/05/2015 11:57 AM

## 2015-03-07 LAB — LEGIONELLA ANTIGEN, URINE

## 2015-03-07 LAB — 10-HYDROXYCARBAZEPINE: Triliptal/MTB(Oxcarbazepin): 10 ug/mL (ref 10–35)

## 2015-05-23 ENCOUNTER — Emergency Department (HOSPITAL_COMMUNITY)
Admission: EM | Admit: 2015-05-23 | Discharge: 2015-05-23 | Disposition: A | Discharge status: Home / Self Care | Payer: Medicare Other | Attending: Emergency Medicine | Admitting: Emergency Medicine

## 2015-05-23 ENCOUNTER — Encounter (HOSPITAL_COMMUNITY): Payer: Self-pay | Admitting: Emergency Medicine

## 2015-05-23 ENCOUNTER — Emergency Department (HOSPITAL_COMMUNITY): Payer: Medicare Other

## 2015-05-23 DIAGNOSIS — R4182 Altered mental status, unspecified: Secondary | ICD-10-CM | POA: Insufficient documentation

## 2015-05-23 DIAGNOSIS — F319 Bipolar disorder, unspecified: Secondary | ICD-10-CM | POA: Diagnosis not present

## 2015-05-23 DIAGNOSIS — Z8679 Personal history of other diseases of the circulatory system: Secondary | ICD-10-CM | POA: Insufficient documentation

## 2015-05-23 DIAGNOSIS — F840 Autistic disorder: Secondary | ICD-10-CM | POA: Diagnosis not present

## 2015-05-23 DIAGNOSIS — T426X5A Adverse effect of other antiepileptic and sedative-hypnotic drugs, initial encounter: Secondary | ICD-10-CM

## 2015-05-23 DIAGNOSIS — R5383 Other fatigue: Secondary | ICD-10-CM | POA: Diagnosis not present

## 2015-05-23 DIAGNOSIS — Z79899 Other long term (current) drug therapy: Secondary | ICD-10-CM | POA: Diagnosis not present

## 2015-05-23 DIAGNOSIS — F4310 Post-traumatic stress disorder, unspecified: Secondary | ICD-10-CM | POA: Insufficient documentation

## 2015-05-23 DIAGNOSIS — Z8669 Personal history of other diseases of the nervous system and sense organs: Secondary | ICD-10-CM | POA: Insufficient documentation

## 2015-05-23 DIAGNOSIS — Z7952 Long term (current) use of systemic steroids: Secondary | ICD-10-CM | POA: Insufficient documentation

## 2015-05-23 LAB — CBC WITH DIFFERENTIAL/PLATELET
Basophils Absolute: 0 10*3/uL (ref 0.0–0.1)
Basophils Relative: 0 %
EOS ABS: 0 10*3/uL (ref 0.0–0.7)
EOS PCT: 0 %
HCT: 45 % (ref 39.0–52.0)
Hemoglobin: 15.2 g/dL (ref 13.0–17.0)
LYMPHS ABS: 1.6 10*3/uL (ref 0.7–4.0)
Lymphocytes Relative: 23 %
MCH: 32.4 pg (ref 26.0–34.0)
MCHC: 33.8 g/dL (ref 30.0–36.0)
MCV: 95.9 fL (ref 78.0–100.0)
MONOS PCT: 11 %
Monocytes Absolute: 0.8 10*3/uL (ref 0.1–1.0)
Neutro Abs: 4.6 10*3/uL (ref 1.7–7.7)
Neutrophils Relative %: 66 %
PLATELETS: 104 10*3/uL — AB (ref 150–400)
RBC: 4.69 MIL/uL (ref 4.22–5.81)
RDW: 14.9 % (ref 11.5–15.5)
WBC: 7 10*3/uL (ref 4.0–10.5)

## 2015-05-23 LAB — COMPREHENSIVE METABOLIC PANEL
ALT: 11 U/L — ABNORMAL LOW (ref 17–63)
ANION GAP: 8 (ref 5–15)
AST: 16 U/L (ref 15–41)
Albumin: 3.5 g/dL (ref 3.5–5.0)
Alkaline Phosphatase: 46 U/L (ref 38–126)
BUN: 12 mg/dL (ref 6–20)
CHLORIDE: 100 mmol/L — AB (ref 101–111)
CO2: 30 mmol/L (ref 22–32)
Calcium: 9.5 mg/dL (ref 8.9–10.3)
Creatinine, Ser: 1 mg/dL (ref 0.61–1.24)
Glucose, Bld: 90 mg/dL (ref 65–99)
POTASSIUM: 3.8 mmol/L (ref 3.5–5.1)
Sodium: 138 mmol/L (ref 135–145)
Total Bilirubin: 0.3 mg/dL (ref 0.3–1.2)
Total Protein: 6.5 g/dL (ref 6.5–8.1)

## 2015-05-23 LAB — URINALYSIS, ROUTINE W REFLEX MICROSCOPIC
BILIRUBIN URINE: NEGATIVE
Glucose, UA: NEGATIVE mg/dL
Hgb urine dipstick: NEGATIVE
Ketones, ur: NEGATIVE mg/dL
Leukocytes, UA: NEGATIVE
NITRITE: NEGATIVE
PROTEIN: NEGATIVE mg/dL
SPECIFIC GRAVITY, URINE: 1.014 (ref 1.005–1.030)
pH: 7 (ref 5.0–8.0)

## 2015-05-23 LAB — VALPROIC ACID LEVEL: VALPROIC ACID LVL: 115 ug/mL — AB (ref 50.0–100.0)

## 2015-05-23 NOTE — Discharge Instructions (Signed)
Skip the next dose of the Valproic acid and follow with his doctor for further dosing recommendations.

## 2015-05-23 NOTE — ED Notes (Signed)
INITIAL ASSESSMENT COMPLETED. PER THE CAREGIVER, THE PT HAS BEEN LETHARGIC AND "NOT HIMSELF" X2 DAYS. AWAITING FURTHER ORDERS.

## 2015-05-23 NOTE — ED Notes (Signed)
PT DISCHARGED. INSTRUCTIONS GIVEN TO CAREGIVER. PT NONVERBAL. PT IN NO APPARENT DISTRESS OR PAIN. THE OPPORTUNITY TO ASK QUESTIONS WAS PROVIDED.

## 2015-05-23 NOTE — ED Notes (Signed)
Pt lives in a group home d/t autism.  Pt has been lethargic beginning over the weekend with trouble with balance.  Pt's caretaker states pt has been stumbling while walking.  Loss of appetite.  No fevers.  Pt does not complain of pain so it is hard to know if pt is hurting.  Pt is nonverbal.

## 2015-05-23 NOTE — ED Notes (Signed)
Pt. ambulated in hall.

## 2015-05-23 NOTE — ED Notes (Signed)
PT AMBULATED IN THE HALLWAY WITH MINIMAL ASSISTANCE. WILL NOTIFY ERA.

## 2015-05-23 NOTE — ED Provider Notes (Signed)
CSN: [id number]     Arrival date & time 05/23/15  1038 History   First MD Initiated Contact with Patient 05/23/15 1046     Chief Complaint  Patient presents with  . Fatigue  . Trouble Walking      Level 5 caveat due to altered mental status.  The history is provided by the patient.   patient is nonverbal at baseline. Reportedly has been more confused and more difficulty walking. He has had this episode in the past with urinary tract infections.  Past Medical History  Diagnosis Date  . Autism   . PTSD (post-traumatic stress disorder)   . Bipolar disorder, unspecified (HCC)   . Profound mental retardation   . Seizure disorder (HCC)   . Rheumatoid arteritis   . Constipation   . Incontinence of bowel   . Bladder incontinence    Past Surgical History  Procedure Laterality Date  . No past surgeries     Family History  Problem Relation Age of Onset  . Family history unknown: Yes   Social History  Substance Use Topics  . Smoking status: Never Smoker   . Smokeless tobacco: Never Used  . Alcohol Use: No    Review of Systems  Unable to perform ROS: Patient nonverbal      Allergies  Review of patient's allergies indicates no known allergies.  Home Medications   Prior to Admission medications   Medication Sig Start Date End Date Taking? Authorizing Provider  Calcium Carbonate-Vitamin D (CALCARB 600/D) 600-400 MG-UNIT per tablet Take 1 tablet by mouth daily.    Yes Historical Provider, MD  divalproex (DEPAKOTE SPRINKLE) 125 MG capsule Take 4 capsules (500 mg total) by mouth daily. Patient taking differently: Take 500-625 mg by mouth daily. Take 4 tablets= 500mg  in the AM and then take 5 tablets= 625 mg at [facility] 01/01/13  Yes 01/03/13, MD  divalproex (DEPAKOTE) 125 MG DR tablet Take 500-625 mg by mouth 2 (two) times daily. Take 4 tablets (500 mg) in the am & Take 5 tablets (625 mg) in the pm.   Yes Historical Provider, MD  folic acid (FOLVITE) 1 MG tablet Take 1 mg by  mouth daily.   Yes Historical Provider, MD  furosemide (LASIX) 20 MG tablet Take 20 mg by mouth daily.   Yes Historical Provider, MD  Oxcarbazepine (TRILEPTAL) 300 MG tablet Take 300 mg by mouth 2 (two) times daily.   Yes Historical Provider, MD  oxybutynin (DITROPAN) 5 MG tablet Take 10 mg by mouth 3 (three) times daily. At 8 am, 2 pm & 8 pm.   Yes Historical Provider, MD  pantoprazole (PROTONIX) 40 MG tablet Take 40 mg by mouth daily.   Yes Historical Provider, MD  polyethylene glycol powder (MIRALAX) powder Take 17 g by mouth daily.   Yes Historical Provider, MD  pravastatin (PRAVACHOL) 20 MG tablet Take 20 mg by mouth daily.   Yes Historical Provider, MD  predniSONE (DELTASONE) 5 MG tablet Take 5 mg by mouth daily with breakfast.   Yes Historical Provider, MD  risperiDONE (RISPERDAL) 0.5 MG tablet Take 0.5 mg by mouth daily. At 4 pm.   Yes Historical Provider, MD  risperiDONE (RISPERDAL) 1 MG tablet Take 1 mg by mouth 2 (two) times daily. At 8 am & 8 pm.   Yes Historical Provider, MD  tamsulosin (FLOMAX) 0.4 MG CAPS capsule Take 0.4 mg by mouth at bedtime.   Yes Historical Provider, MD  feeding supplement (ENSURE) PUDG Take 1 Container  by mouth 3 (three) times daily between meals. Patient not taking: Reported on 03/03/2015 01/01/13   Vassie Loll, MD  levofloxacin (LEVAQUIN) 750 MG tablet Take 1 tablet (750 mg total) by mouth daily. Patient not taking: Reported on 05/23/2015 03/05/15   Richarda Overlie, MD  pantoprazole (PROTONIX) 20 MG tablet Take 1 tablet (20 mg total) by mouth daily. Patient not taking: Reported on 03/03/2015 12/26/12   [name], MD   BP 137/64 mmHg  Pulse 65  Temp(Src) 97.6 F (36.4 C) (Axillary)  Resp 18  SpO2 96% Physical Exam  Constitutional: He appears well-developed.  HENT:  Head: Atraumatic.  Cardiovascular: Normal rate.   Pulmonary/Chest: Effort normal. No respiratory distress.  Abdominal: Soft. There is no tenderness.  Musculoskeletal: Normal range of  motion.  Extremities somewhat contracted, baseline per caregiver  Neurological:  Patient is somewhat sleepy but will arouse to voice. Baseline per caregiver.  Skin: Skin is warm.    ED Course  Procedures (including critical care time) Labs Review Labs Reviewed  CBC WITH DIFFERENTIAL/PLATELET - Abnormal; Notable for the following:    Platelets 104 (*)    All other components within normal limits  VALPROIC ACID LEVEL - Abnormal; Notable for the following:    Valproic Acid Lvl 115 (*)    All other components within normal limits  COMPREHENSIVE METABOLIC PANEL - Abnormal; Notable for the following:    Chloride 100 (*)    ALT 11 (*)    All other components within normal limits  URINALYSIS, ROUTINE W REFLEX MICROSCOPIC (NOT AT [facility])    Imaging Review Dg Chest Portable 1 View  05/23/2015  CLINICAL DATA:  74 year old male with altered mental status, lethargy and weakness. Initial encounter. EXAM: PORTABLE CHEST 1 VIEW COMPARISON:  Chest CT 03/04/2015 and earlier. FINDINGS: Portable AP semi upright view at 1147 hours. Continued low lung volumes. Increased reticulonodular density at the left lung base. No consolidation or pleural effusion. Stable cardiac size and mediastinal contours. No pneumothorax. IMPRESSION: Continued low lung volumes. Increased reticulonodular density at the left lung base suspicious for acute aspiration or pneumonia in this setting. No pleural effusion identified. Electronically Signed   By: Odessa Fleming M.D.   On: 05/23/2015 12:22   I have personally reviewed and evaluated these images and lab results as part of my medical decision-making.   EKG Interpretation None      MDM   Final diagnoses:  Valproic acid causing adverse effect in therapeutic use, initial encounter    Patient with difficulty ambulating. Has had urinary tract infections in the past but does not appear to be one today. Discussed with caregivers. Depakote level is mildly elevated. Will skip tonight's  dose and may need re-dosing. Will discharge home. Doubt stroke.    Benjiman Core, MD 05/23/15 [phone]

## 2015-08-31 ENCOUNTER — Encounter (HOSPITAL_COMMUNITY): Payer: Self-pay

## 2015-08-31 ENCOUNTER — Inpatient Hospital Stay (HOSPITAL_COMMUNITY)
Admission: EM | Admit: 2015-08-31 | Discharge: 2015-09-02 | DRG: 194 | Disposition: A | Discharge status: Home / Self Care | Payer: Medicare Other | Attending: Student in an Organized Health Care Education/Training Program | Admitting: Student in an Organized Health Care Education/Training Program

## 2015-08-31 ENCOUNTER — Emergency Department (HOSPITAL_COMMUNITY): Payer: Medicare Other

## 2015-08-31 DIAGNOSIS — E785 Hyperlipidemia, unspecified: Secondary | ICD-10-CM | POA: Diagnosis present

## 2015-08-31 DIAGNOSIS — F73 Profound intellectual disabilities: Secondary | ICD-10-CM | POA: Diagnosis present

## 2015-08-31 DIAGNOSIS — J189 Pneumonia, unspecified organism: Secondary | ICD-10-CM | POA: Diagnosis not present

## 2015-08-31 DIAGNOSIS — J101 Influenza due to other identified influenza virus with other respiratory manifestations: Secondary | ICD-10-CM | POA: Diagnosis present

## 2015-08-31 DIAGNOSIS — F319 Bipolar disorder, unspecified: Secondary | ICD-10-CM | POA: Diagnosis present

## 2015-08-31 DIAGNOSIS — I959 Hypotension, unspecified: Secondary | ICD-10-CM | POA: Diagnosis present

## 2015-08-31 DIAGNOSIS — R600 Localized edema: Secondary | ICD-10-CM | POA: Diagnosis not present

## 2015-08-31 DIAGNOSIS — R32 Unspecified urinary incontinence: Secondary | ICD-10-CM | POA: Diagnosis present

## 2015-08-31 DIAGNOSIS — Z7952 Long term (current) use of systemic steroids: Secondary | ICD-10-CM

## 2015-08-31 DIAGNOSIS — F72 Severe intellectual disabilities: Secondary | ICD-10-CM | POA: Diagnosis present

## 2015-08-31 DIAGNOSIS — J1000 Influenza due to other identified influenza virus with unspecified type of pneumonia: Principal | ICD-10-CM | POA: Diagnosis present

## 2015-08-31 DIAGNOSIS — J1108 Influenza due to unidentified influenza virus with specified pneumonia: Secondary | ICD-10-CM | POA: Diagnosis not present

## 2015-08-31 DIAGNOSIS — M069 Rheumatoid arthritis, unspecified: Secondary | ICD-10-CM | POA: Diagnosis present

## 2015-08-31 DIAGNOSIS — F840 Autistic disorder: Secondary | ICD-10-CM | POA: Diagnosis present

## 2015-08-31 DIAGNOSIS — G40909 Epilepsy, unspecified, not intractable, without status epilepticus: Secondary | ICD-10-CM | POA: Diagnosis present

## 2015-08-31 LAB — COMPREHENSIVE METABOLIC PANEL
ALBUMIN: 2.9 g/dL — AB (ref 3.5–5.0)
ALK PHOS: 43 U/L (ref 38–126)
ALT: 13 U/L — ABNORMAL LOW (ref 17–63)
ANION GAP: 17 — AB (ref 5–15)
AST: 32 U/L (ref 15–41)
BUN: 14 mg/dL (ref 6–20)
CO2: 19 mmol/L — ABNORMAL LOW (ref 22–32)
Calcium: 9.1 mg/dL (ref 8.9–10.3)
Chloride: 100 mmol/L — ABNORMAL LOW (ref 101–111)
Creatinine, Ser: 1.05 mg/dL (ref 0.61–1.24)
GLUCOSE: 95 mg/dL (ref 65–99)
Potassium: 3.2 mmol/L — ABNORMAL LOW (ref 3.5–5.1)
Sodium: 136 mmol/L (ref 135–145)
TOTAL PROTEIN: 6.1 g/dL — AB (ref 6.5–8.1)
Total Bilirubin: 0.9 mg/dL (ref 0.3–1.2)

## 2015-08-31 LAB — CBC WITH DIFFERENTIAL/PLATELET
Basophils Absolute: 0 10*3/uL (ref 0.0–0.1)
Basophils Relative: 0 %
Eosinophils Absolute: 0 10*3/uL (ref 0.0–0.7)
Eosinophils Relative: 0 %
HEMATOCRIT: 38.6 % — AB (ref 39.0–52.0)
HEMOGLOBIN: 13.6 g/dL (ref 13.0–17.0)
LYMPHS ABS: 0.7 10*3/uL (ref 0.7–4.0)
Lymphocytes Relative: 7 %
MCH: 31.9 pg (ref 26.0–34.0)
MCHC: 35.2 g/dL (ref 30.0–36.0)
MCV: 90.6 fL (ref 78.0–100.0)
MONOS PCT: 18 %
Monocytes Absolute: 1.9 10*3/uL — ABNORMAL HIGH (ref 0.1–1.0)
NEUTROS ABS: 8.2 10*3/uL — AB (ref 1.7–7.7)
NEUTROS PCT: 75 %
Platelets: 120 10*3/uL — ABNORMAL LOW (ref 150–400)
RBC: 4.26 MIL/uL (ref 4.22–5.81)
RDW: 14 % (ref 11.5–15.5)
WBC: 10.8 10*3/uL — ABNORMAL HIGH (ref 4.0–10.5)

## 2015-08-31 LAB — URINALYSIS, ROUTINE W REFLEX MICROSCOPIC
Bilirubin Urine: NEGATIVE
GLUCOSE, UA: NEGATIVE mg/dL
Hgb urine dipstick: NEGATIVE
KETONES UR: 15 mg/dL — AB
LEUKOCYTES UA: NEGATIVE
Nitrite: NEGATIVE
PROTEIN: NEGATIVE mg/dL
Specific Gravity, Urine: 1.013 (ref 1.005–1.030)
pH: 6 (ref 5.0–8.0)

## 2015-08-31 LAB — I-STAT CG4 LACTIC ACID, ED: Lactic Acid, Venous: 1.71 mmol/L (ref 0.5–2.0)

## 2015-08-31 LAB — VALPROIC ACID LEVEL: Valproic Acid Lvl: 72 ug/mL (ref 50.0–100.0)

## 2015-08-31 LAB — CBG MONITORING, ED: GLUCOSE-CAPILLARY: 87 mg/dL (ref 65–99)

## 2015-08-31 MED ORDER — ACETAMINOPHEN 650 MG RE SUPP
650.0000 mg | RECTAL | Status: AC
  Administered 2015-08-31: 650 mg via RECTAL
  Filled 2015-08-31: qty 1

## 2015-08-31 MED ORDER — DEXTROSE 5 % IV SOLN: 250.0000 mg | INTRAVENOUS | Status: DC

## 2015-08-31 MED ORDER — RISPERIDONE 0.5 MG PO TABS: 0.5000 mg | ORAL_TABLET | Freq: Every day | ORAL | Status: DC

## 2015-08-31 MED ORDER — POTASSIUM CHLORIDE 10 MEQ/100ML IV SOLN
10.0000 meq | INTRAVENOUS | Status: DC
  Administered 2015-08-31 (×2): 10 meq via INTRAVENOUS
  Filled 2015-08-31 (×2): qty 100

## 2015-08-31 MED ORDER — ONDANSETRON HCL 4 MG PO TABS: 4.0000 mg | ORAL_TABLET | Freq: Four times a day (QID) | ORAL | Status: DC | PRN

## 2015-08-31 MED ORDER — DEXTROSE 5 % IV SOLN
1.0000 g | INTRAVENOUS | Status: DC
  Administered 2015-09-01 – 2015-09-02 (×2): 1 g via INTRAVENOUS
  Filled 2015-08-31 (×2): qty 10

## 2015-08-31 MED ORDER — ACETAMINOPHEN 650 MG RE SUPP: 650.0000 mg | Freq: Four times a day (QID) | RECTAL | Status: DC | PRN

## 2015-08-31 MED ORDER — FOLIC ACID 1 MG PO TABS
1.0000 mg | ORAL_TABLET | Freq: Every day | ORAL | Status: DC
  Administered 2015-09-01 – 2015-09-02 (×2): 1 mg via ORAL
  Filled 2015-08-31 (×2): qty 1

## 2015-08-31 MED ORDER — BENZONATATE 100 MG PO CAPS
100.0000 mg | ORAL_CAPSULE | Freq: Two times a day (BID) | ORAL | Status: DC
  Administered 2015-08-31 – 2015-09-02 (×4): 100 mg via ORAL
  Filled 2015-08-31 (×4): qty 1

## 2015-08-31 MED ORDER — TAMSULOSIN HCL 0.4 MG PO CAPS
0.4000 mg | ORAL_CAPSULE | Freq: Every day | ORAL | Status: DC
  Administered 2015-08-31 – 2015-09-01 (×2): 0.4 mg via ORAL
  Filled 2015-08-31 (×2): qty 1

## 2015-08-31 MED ORDER — PREDNISONE 20 MG PO TABS: 20.0000 mg | ORAL_TABLET | Freq: Every day | ORAL | Status: DC

## 2015-08-31 MED ORDER — SODIUM CHLORIDE 0.9 % IV BOLUS (SEPSIS)
500.0000 mL | Freq: Once | INTRAVENOUS | Status: AC
  Administered 2015-08-31: 500 mL via INTRAVENOUS

## 2015-08-31 MED ORDER — SODIUM CHLORIDE 0.9 % IV BOLUS (SEPSIS)
1000.0000 mL | Freq: Once | INTRAVENOUS | Status: AC
  Administered 2015-08-31: 1000 mL via INTRAVENOUS

## 2015-08-31 MED ORDER — PREDNISONE 5 MG PO TABS: 15.0000 mg | ORAL_TABLET | Freq: Every day | ORAL | Status: DC

## 2015-08-31 MED ORDER — CEFTRIAXONE SODIUM 1 G IJ SOLR
1.0000 g | INTRAMUSCULAR | Status: DC
  Filled 2015-08-31: qty 10

## 2015-08-31 MED ORDER — PRAVASTATIN SODIUM 20 MG PO TABS: 20.0000 mg | ORAL_TABLET | Freq: Every day | ORAL | Status: DC

## 2015-08-31 MED ORDER — ENOXAPARIN SODIUM 80 MG/0.8ML ~~LOC~~ SOLN: 1.0000 mg/kg | SUBCUTANEOUS | Status: DC

## 2015-08-31 MED ORDER — DIVALPROEX SODIUM 250 MG PO DR TAB
250.0000 mg | DELAYED_RELEASE_TABLET | Freq: Two times a day (BID) | ORAL | Status: DC
  Filled 2015-08-31: qty 2

## 2015-08-31 MED ORDER — SODIUM CHLORIDE 0.9 % IV SOLN
INTRAVENOUS | Status: DC
  Administered 2015-08-31: 18:00:00 via INTRAVENOUS

## 2015-08-31 MED ORDER — SODIUM CHLORIDE 0.9% FLUSH
3.0000 mL | Freq: Two times a day (BID) | INTRAVENOUS | Status: DC
  Administered 2015-08-31 – 2015-09-01 (×2): 10 mL via INTRAVENOUS
  Administered 2015-09-02: 3 mL via INTRAVENOUS

## 2015-08-31 MED ORDER — DIVALPROEX SODIUM 125 MG PO CSDR
250.0000 mg | DELAYED_RELEASE_CAPSULE | Freq: Every day | ORAL | Status: DC
Start: 2015-08-31 — End: 2015-09-02
  Administered 2015-09-01 – 2015-09-02 (×2): 250 mg via ORAL
  Filled 2015-08-31 (×2): qty 2

## 2015-08-31 MED ORDER — OXCARBAZEPINE 300 MG PO TABS
300.0000 mg | ORAL_TABLET | Freq: Two times a day (BID) | ORAL | Status: DC
  Administered 2015-08-31 – 2015-09-02 (×4): 300 mg via ORAL
  Filled 2015-08-31 (×7): qty 1

## 2015-08-31 MED ORDER — PRAVASTATIN SODIUM 20 MG PO TABS
20.0000 mg | ORAL_TABLET | Freq: Every day | ORAL | Status: DC
  Administered 2015-08-31 – 2015-09-01 (×2): 20 mg via ORAL
  Filled 2015-08-31 (×2): qty 1

## 2015-08-31 MED ORDER — PREDNISONE 5 MG PO TABS
15.0000 mg | ORAL_TABLET | Freq: Every day | ORAL | Status: DC
  Administered 2015-08-31: 15 mg via ORAL
  Filled 2015-08-31: qty 1

## 2015-08-31 MED ORDER — ONDANSETRON HCL 4 MG/2ML IJ SOLN: 4.0000 mg | Freq: Four times a day (QID) | INTRAMUSCULAR | Status: DC | PRN

## 2015-08-31 MED ORDER — DIVALPROEX SODIUM 125 MG PO CSDR
375.0000 mg | DELAYED_RELEASE_CAPSULE | Freq: Every day | ORAL | Status: DC
  Administered 2015-08-31 – 2015-09-01 (×2): 375 mg via ORAL
  Filled 2015-08-31 (×2): qty 3

## 2015-08-31 MED ORDER — ENOXAPARIN SODIUM 40 MG/0.4ML ~~LOC~~ SOLN: 40.0000 mg | SUBCUTANEOUS | Status: DC

## 2015-08-31 MED ORDER — ACETAMINOPHEN 325 MG PO TABS
650.0000 mg | ORAL_TABLET | Freq: Four times a day (QID) | ORAL | Status: DC | PRN
  Administered 2015-09-01: 650 mg via ORAL
  Filled 2015-08-31: qty 2

## 2015-08-31 MED ORDER — RISPERIDONE 1 MG PO TABS
1.0000 mg | ORAL_TABLET | Freq: Two times a day (BID) | ORAL | Status: DC
  Administered 2015-08-31 – 2015-09-02 (×4): 1 mg via ORAL
  Filled 2015-08-31 (×7): qty 1

## 2015-08-31 MED ORDER — DEXTROSE 5 % IV SOLN
250.0000 mg | INTRAVENOUS | Status: DC
  Administered 2015-09-01: 250 mg via INTRAVENOUS
  Filled 2015-08-31 (×2): qty 250

## 2015-08-31 MED ORDER — DEXTROSE 5 % IV SOLN
500.0000 mg | Freq: Once | INTRAVENOUS | Status: AC
  Administered 2015-08-31: 500 mg via INTRAVENOUS
  Filled 2015-08-31: qty 500

## 2015-08-31 MED ORDER — SENNOSIDES-DOCUSATE SODIUM 8.6-50 MG PO TABS: 1.0000 | ORAL_TABLET | Freq: Every evening | ORAL | Status: DC | PRN

## 2015-08-31 MED ORDER — DEXTROSE-NACL 5-0.9 % IV SOLN
INTRAVENOUS | Status: DC
  Administered 2015-09-01: via INTRAVENOUS

## 2015-08-31 MED ORDER — ENOXAPARIN SODIUM 40 MG/0.4ML ~~LOC~~ SOLN
40.0000 mg | SUBCUTANEOUS | Status: DC
  Administered 2015-09-01 – 2015-09-02 (×2): 40 mg via SUBCUTANEOUS
  Filled 2015-08-31 (×3): qty 0.4

## 2015-08-31 MED ORDER — DEXTROSE 5 % IV SOLN
1.0000 g | Freq: Once | INTRAVENOUS | Status: AC
  Administered 2015-08-31: 1 g via INTRAVENOUS
  Filled 2015-08-31: qty 10

## 2015-08-31 MED ORDER — ACETAMINOPHEN 325 MG PO TABS
650.0000 mg | ORAL_TABLET | Freq: Once | ORAL | Status: DC
  Filled 2015-08-31: qty 2

## 2015-08-31 MED ORDER — RISPERIDONE 0.5 MG PO TABS
0.5000 mg | ORAL_TABLET | ORAL | Status: DC
  Administered 2015-09-01: 0.5 mg via ORAL
  Filled 2015-08-31 (×2): qty 1

## 2015-08-31 MED ORDER — OXYBUTYNIN CHLORIDE 5 MG PO TABS
10.0000 mg | ORAL_TABLET | Freq: Three times a day (TID) | ORAL | Status: DC
  Administered 2015-08-31 – 2015-09-02 (×5): 10 mg via ORAL
  Filled 2015-08-31 (×7): qty 2

## 2015-08-31 MED ORDER — PANTOPRAZOLE SODIUM 40 MG PO TBEC
40.0000 mg | DELAYED_RELEASE_TABLET | Freq: Every day | ORAL | Status: DC
  Administered 2015-09-01 – 2015-09-02 (×2): 40 mg via ORAL
  Filled 2015-08-31 (×2): qty 1

## 2015-08-31 NOTE — ED Notes (Signed)
Placed oxygen  2 liters J[place]>  Sats were 90% when pt. Was sleeping.  Sats presently on oxygen 2L is 95%

## 2015-08-31 NOTE — ED Notes (Signed)
Reported to Dr. Dimple Casey that pt.s BP is 98/56 and 15 minutes before it was 85/48.  No orders received.  They are in the process of writing orders for the pt.

## 2015-08-31 NOTE — H&P (Signed)
Date: 08/31/2015               Patient Name:  Alan Rhodes MRN: [id number]  DOB: 07/11/1940 Age / Sex: 75 y.o., male   PCP: Shelbie Ammons, MD         Medical Service: Internal Medicine Teaching Service         Attending Physician: Dr. Tyson Alias, MD    First Contact: [name], MS 3 Pager: [phone]   Second Contact: Third Contact: Dr. Dimple Casey Dr. Danella Penton Pager: [phone] Pager: [phone]        After Hours (After 5p/  First Contact Pager: [phone]  weekends / holidays): Second Contact Pager: [phone]         Chief Complaint: Weakness, cough, fever  History of Present Illness: 75 y/o man with PMHx of MR, autism, bipolar disorder, rheumatoid arthritis, urinary incontinence, and seizures presents to ED via EMS after his home health nursing aide noticed him to be too weak to participate in transfer to the car, febrile, and coughing. He has been coughing for 4 days prior to presentation, wet sounding but without much sputum poduction. He is nonverbal which is usual. She states that his not following directions and weakness are consistent with previous hospitalizations for illness. EMS staff reported a fever of 100.39F.  Of note patient nonverbal and history obtained from chart review and home health aide present at bedside.  In particular he was hospitalized in 12/2012 and 02/2015 for pneumonia. He was evaluated by Dr. 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 Caregiver denies history of obvious choking or coughing worsened with eating. He is taking prednisone 5mg  chronically for rheumatoid arthritis.  After arrival to the Emergency Department he was noted to be hypotensive which transiently improved after NS bolus. CT head unremarkable for new acute process. CXR obtained showing multifocal pneumonia, he was started on azithromycin and ceftriaxone and admitted to internal  medicine.  Meds: Current Facility-Administered Medications  Medication Dose Route Frequency Provider Last Rate Last Dose  . 0.9 %  sodium chloride infusion   Intravenous Continuous Otis Brace, MD 125 mL/hr at 08/31/15 1808    . acetaminophen (TYLENOL) tablet 650 mg  650 mg Oral Q6H PRN Otis Brace, MD       Or  . acetaminophen (TYLENOL) suppository 650 mg  650 mg Rectal Q6H PRN Marjan Rabbani, MD      . acetaminophen (TYLENOL) tablet 650 mg  650 mg Oral Once Gwyneth Sprout, MD   650 mg at 08/31/15 1345  . [START ON 09/01/2015] azithromycin (ZITHROMAX) 250 mg in dextrose 5 % 125 mL IVPB  250 mg Intravenous Q24H Tyson Alias, MD      . benzonatate (TESSALON) capsule 100 mg  100 mg Oral BID Otis Brace, MD      . Melene Muller ON 09/01/2015] cefTRIAXone (ROCEPHIN) 1 g in dextrose 5 % 50 mL IVPB  1 g Intravenous Q24H Marjan Rabbani, MD      . divalproex (DEPAKOTE) DR tablet 250-500 mg  250-500 mg Oral BID Otis Brace, MD      . folic acid (FOLVITE) tablet 1 mg  1 mg Oral Daily Marjan Rabbani, MD      . ondansetron (ZOFRAN) tablet 4 mg  4 mg Oral Q6H PRN Marjan Rabbani, MD       Or  . ondansetron (ZOFRAN) injection 4 mg  4 mg  Intravenous Q6H PRN Otis Brace, MD      . Oxcarbazepine (TRILEPTAL) tablet 300 mg  300 mg Oral BID Otis Brace, MD      . oxybutynin (DITROPAN) tablet 10 mg  10 mg Oral TID Otis Brace, MD      . pantoprazole (PROTONIX) EC tablet 40 mg  40 mg Oral Daily Marjan Rabbani, MD      . potassium chloride 10 mEq in 100 mL IVPB  10 mEq Intravenous Q1 Hr x 4 Marjan Rabbani, MD 100 mL/hr at 08/31/15 1813 10 mEq at 08/31/15 1813  . [START ON 09/01/2015] pravastatin (PRAVACHOL) tablet 20 mg  20 mg Oral Daily Marjan Rabbani, MD      . predniSONE (DELTASONE) tablet 20 mg  20 mg Oral Q breakfast Marjan Rabbani, MD      . risperiDONE (RISPERDAL) tablet 0.5 mg  0.5 mg Oral Daily Marjan Rabbani, MD      . risperiDONE (RISPERDAL) tablet 1 mg  1 mg Oral BID Marjan  Rabbani, MD      . senna-docusate (Senokot-S) tablet 1 tablet  1 tablet Oral QHS PRN Marjan Rabbani, MD      . sodium chloride flush (NS) 0.9 % injection 3 mL  3 mL Intravenous Q12H Marjan Rabbani, MD      . tamsulosin (FLOMAX) capsule 0.4 mg  0.4 mg Oral QHS Otis Brace, MD       Current Outpatient Prescriptions  Medication Sig Dispense Refill  . Calcium Carbonate-Vitamin D (CALCARB 600/D) 600-400 MG-UNIT per tablet Take 1 tablet by mouth daily.     . divalproex (DEPAKOTE) 125 MG DR tablet Take 250-375 mg by mouth 2 (two) times daily. Take 2 tablets (250 mg) in the am & Take 3 tablets (375 mg) in the pm.    . folic acid (FOLVITE) 1 MG tablet Take 1 mg by mouth daily.    . furosemide (LASIX) 20 MG tablet Take 20 mg by mouth daily.    Marland Kitchen guaiFENesin (MUCINEX) 600 MG 12 hr tablet Take 600 mg by mouth 2 (two) times daily as needed for cough or to loosen phlegm.    . Oxcarbazepine (TRILEPTAL) 300 MG tablet Take 300 mg by mouth 2 (two) times daily.    Marland Kitchen oxybutynin (DITROPAN) 5 MG tablet Take 10 mg by mouth 3 (three) times daily. At 8 am, 2 pm & 8 pm.    . pantoprazole (PROTONIX) 40 MG tablet Take 40 mg by mouth daily.    . polyethylene glycol powder (MIRALAX) powder Take 17 g by mouth daily.    . pravastatin (PRAVACHOL) 20 MG tablet Take 20 mg by mouth daily. 5 PM    . predniSONE (DELTASONE) 5 MG tablet Take 5 mg by mouth daily with breakfast.    . risperiDONE (RISPERDAL) 0.5 MG tablet Take 0.5 mg by mouth daily. At 4 pm.    . risperiDONE (RISPERDAL) 1 MG tablet Take 1 mg by mouth 2 (two) times daily. At 8 am & 8 pm.    . tamsulosin (FLOMAX) 0.4 MG CAPS capsule Take 0.4 mg by mouth at bedtime.      Allergies: Allergies as of 08/31/2015  . (No Known Allergies)   Past Medical History  Diagnosis Date  . Autism   . PTSD (post-traumatic stress disorder)   . Bipolar disorder, unspecified (HCC)   . Profound mental retardation   . Seizure disorder (HCC)   . Rheumatoid arteritis   .  Constipation   . Incontinence of bowel   .  Bladder incontinence    Past Surgical History  Procedure Laterality Date  . No past surgeries     Family History  Problem Relation Age of Onset  . Family history unknown: Yes   Social History   Social History  . Marital Status: Single    Spouse Name: N/A  . Number of Children: N/A  . Years of Education: N/A   Occupational History  . Not on file.   Social History Main Topics  . Smoking status: Never Smoker   . Smokeless tobacco: Never Used  . Alcohol Use: No  . Drug Use: No  . Sexual Activity: Not on file   Other Topics Concern  . Not on file   Social History Narrative   Review of Systems: Review of Systems  Constitutional: Positive for fever.  Eyes: Negative for discharge.  Respiratory: Positive for cough. Negative for hemoptysis.   Cardiovascular: Positive for leg swelling.  Gastrointestinal: Negative for vomiting.  Genitourinary: Positive for frequency.  Musculoskeletal: Negative for falls.  Skin: Negative for rash.  Neurological: Positive for weakness. Negative for loss of consciousness and headaches.  Endo/Heme/Allergies: Does not bruise/bleed easily.  Psychiatric/Behavioral: The patient does not have insomnia.    Physical Exam: Blood pressure 83/59, pulse 62, temperature 99.8 F (37.7 C), temperature source Oral, resp. rate 19, height  (1.575 m), weight 66.679 kg (147 lb), SpO2 90 %.   GENERAL- sleeping, arouses to touch not voice, nonverbal HEENT- Refusing to open mouth to command or eyes, no cervical LN enlargement. CARDIAC- RRR, no murmurs, rubs or gallops. RESP- Rhonchi appreciated on expiration in upper and lower lung fields ABDOMEN- Soft, no guarding or rebound, normoactive bowel sounds present NEURO- Unable to assess 2/2 patient mental status EXTREMITIES- symmetric, 1+ pedal edema halfway to level of shin bilaterally. SKIN- Hot, dry, No rash or lesion. PSYCH- nonverbal, noncooperative, was awake  ane coughing   Lab results: Basic Metabolic Panel:  Recent Labs  16/10/96 1135  NA 136  K 3.2*  CL 100*  CO2 19*  GLUCOSE 95  BUN 14  CREATININE 1.05  CALCIUM 9.1   Liver Function Tests:  Recent Labs  08/31/15 1135  AST 32  ALT 13*  ALKPHOS 43  BILITOT 0.9  PROT 6.1*  ALBUMIN 2.9*   CBC:  Recent Labs  08/31/15 1135  WBC 10.8*  NEUTROABS 8.2*  HGB 13.6  HCT 38.6*  MCV 90.6  PLT 120*   Urinalysis:  Recent Labs  08/31/15 1220  COLORURINE YELLOW  LABSPEC 1.013  PHURINE 6.0  GLUCOSEU NEGATIVE  HGBUR NEGATIVE  BILIRUBINUR NEGATIVE  KETONESUR 15*  PROTEINUR NEGATIVE  NITRITE NEGATIVE  LEUKOCYTESUR NEGATIVE    Imaging results:  Dg Chest 2 View  08/31/2015  CLINICAL DATA:  75 year old male with a history of cough and weakness EXAM: CHEST - 2 VIEW COMPARISON:  08/31/2015 CT 03/04/2015 FINDINGS: Cardiomediastinal silhouette unchanged. Low lung volumes. Interval development of left greater than right airspace disease and interstitial opacities. No pneumothorax. Nodule identified at the left base on chest CT 03/04/2015, not well visualized on the current study. Osteopenia. Configuration of vertebral bodies is unchanged from comparison CT. Unremarkable appearance of the upper abdomen. IMPRESSION: Interval development of multifocal pneumonia, worst on the left. Followup PA and lateral chest X-ray is recommended in 3-4 weeks following trial of antibiotic therapy to ensure resolution and exclude underlying malignancy. Signed, Yvone Neu. Loreta Ave, DO [facility] Radiology Electronically Signed   By: Gilmer Mor D.O.  On: 08/31/2015 13:43   Ct Head Wo Contrast  08/31/2015  CLINICAL DATA:  Lethargy/altered mental status.  History of seizures EXAM: CT HEAD WITHOUT CONTRAST TECHNIQUE: Contiguous axial images were obtained from the base of the skull through the vertex without intravenous contrast. COMPARISON:  March 04, 2015  FINDINGS: Mild diffuse atrophy is stable. There is no intracranial mass, hemorrhage, extra-axial fluid collection, or midline shift. There is patchy small vessel disease in the centra semiovale bilaterally, stable. No acute infarct identified. The bony calvarium appears intact. The mastoid air cells are clear. No intraorbital lesions are identified. There is mucosal thickening in the maxillary antra bilaterally as well as in several ethmoid air cells bilaterally. IMPRESSION: Mild atrophy with stable patchy periventricular small vessel disease. No acute infarct evident. No hemorrhage or mass effect. Areas of paranasal sinus disease noted. Electronically Signed   By: Bretta Bang III M.D.   On: 08/31/2015 11:16   Dg Chest Port 1 View  08/31/2015  CLINICAL DATA:  Altered mental status.  History of seizure. EXAM: PORTABLE CHEST 1 VIEW COMPARISON:  Single view of the chest 05/23/2015 and CT chest 03/04/2015. PA and lateral chest 03/03/2015. FINDINGS: The patient is rotated on the study. Lung volumes are low with basilar atelectasis. No consolidative process, pneumothorax or effusion is identified. Heart size is normal. IMPRESSION: No acute disease. Electronically Signed   By: Drusilla Kanner M.D.   On: 08/31/2015 11:13   Other results: EKG: Sinus rhythm, QTc 429,   Assessment & Plan by Problem: Community acquired pneumonia: 4 days of respiratory symptoms now with fever, hypotension, multifocal infiltrates on CXR. History of previous pneumonias in 12/2012 and more recently in 02/2015. He has risk factors of chronic prednisone use and possibly of aspiration events. Influenza negative -Continue 1L bolus+maintenance fluids -Azithromycin 250mg  3/15>>> -Ceftriaxone 1g 3/15>>> -Tessalon -Repeat AM CBC, Bmet  Rheumatoid arthritis: On 5mg  prednisone PTA. Hypotensive, sepsis picture, increase for stress dose steroids -prednisone 20mg   Chronic urinary incontinence: Adult underwear a baseline. Nursing care and  continue PTA meds. UA negative. -Oxybutynin 10mg  TID -Tamsulosin 04mg   Seizure disorder: No known recent seizure activity. -Oxcarbazepine 300mg  BID -Depakote BID   Mental retardation, autism, bipolar disorder: Patient nonverbal at baseline. Difficult to assess if altered mental status at this time. -Risperdal 2.5mg  daily  Chronic lower extremity edema: hold diuretics foy hypotension. On lasix 20mg  PTA  Diet: NPO VTE ppx: [place] lovenox FULL CODE  Dispo: Disposition is deferred at this time, awaiting improvement of current medical problems. Anticipated discharge in approximately 2-4 day(s).   The patient does have a current PCP (Imran , MD) and does need an [facility] hospital follow-up appointment after discharge.  The patient does have transportation limitations that hinder transportation to clinic appointments.  Signed: , MD 08/31/2015, 6:41 PM

## 2015-08-31 NOTE — ED Notes (Signed)
Attempted to call Report , unsuccessful

## 2015-08-31 NOTE — H&P (Signed)
Date: 08/31/2015               Patient Name:  Alan Rhodes MRN: [id number]  DOB: August 08, 1940 Age / Sex: 75 y.o., male   PCP: Shelbie Ammons, MD              Medical Service: Internal Medicine Teaching Service              Attending Physician: Dr. Tyson Alias, MD    First Contact: [name], MS3 Pager: [phone]  Second Contact: Dr. Dimple Casey Pager: [phone]  Third Contact Dr. Danella Penton Pager: [phone]       After Hours (After 5p/  First Contact Pager: [phone]  weekends / holidays): Second Contact Pager: [phone]   Chief Complaint: Productive cough and lethargy  History of Present Illness: Alan Rhodes is a 75 yo M with hx of mental retardation, autism and seizure disorder who presents with 2 days of productive cough and lethargy. History is provided by the home aid as Alan Rhodes is nonverbal. On Monday Alan Rhodes experienced worsening of his chronic cough. His cough is typically dry but has become productive of sputum since Monday. He has been appearing more tired than usual and needed some help getting out of bed. He was still behaving normally until after he took a shower when he became very tired and unable to walk around. His aid tried to get him into the car to go to the doctor but he couldn't walk that far so she called EMS. His caregiver denies any trouble swallowing or choking while he eats or otherwise. Alan Rhodes hasn't been eating or drinking as much as usual since Monday and is noted to have been urinating less frequently than normal, but he has been having normal bowel movements. He has no known sick contacts at home but he attends a day-time care facility and his caregiver is not sure if anyone there has been sick.   He became hypotensive in the ED and was given 500 mL NS after which he improved. He was started on azithromycin and ceftriaxone after a CXR showing multifocal pneumonia.  Meds: Current Facility-Administered Medications  Medication Dose Route Frequency  Provider Last Rate Last Dose  . 0.9 %  sodium chloride infusion   Intravenous Continuous Otis Brace, MD 125 mL/hr at 08/31/15 1808    . acetaminophen (TYLENOL) tablet 650 mg  650 mg Oral Q6H PRN Otis Brace, MD       Or  . acetaminophen (TYLENOL) suppository 650 mg  650 mg Rectal Q6H PRN Marjan Rabbani, MD      . acetaminophen (TYLENOL) tablet 650 mg  650 mg Oral Once Gwyneth Sprout, MD   650 mg at 08/31/15 1345  . [START ON 09/01/2015] azithromycin (ZITHROMAX) 250 mg in dextrose 5 % 125 mL IVPB  250 mg Intravenous Q24H Tyson Alias, MD      . benzonatate (TESSALON) capsule 100 mg  100 mg Oral BID Otis Brace, MD      . Melene Muller ON 09/01/2015] cefTRIAXone (ROCEPHIN) 1 g in dextrose 5 % 50 mL IVPB  1 g Intravenous Q24H Marjan Rabbani, MD      . divalproex (DEPAKOTE) DR tablet 250-500 mg  250-500 mg Oral BID Otis Brace, MD      . folic acid (FOLVITE) tablet 1 mg  1 mg Oral Daily Marjan Rabbani, MD   1 mg at 08/31/15 1851  . ondansetron (ZOFRAN) tablet 4 mg  4 mg Oral Q6H PRN Marjan  Rabbani, MD       Or  . ondansetron (ZOFRAN) injection 4 mg  4 mg Intravenous Q6H PRN Marjan Rabbani, MD      . Oxcarbazepine (TRILEPTAL) tablet 300 mg  300 mg Oral BID Marjan Rabbani, MD      . oxybutynin (DITROPAN) tablet 10 mg  10 mg Oral TID Otis Brace, MD      . pantoprazole (PROTONIX) EC tablet 40 mg  40 mg Oral Daily Marjan Rabbani, MD   40 mg at 08/31/15 1852  . potassium chloride 10 mEq in 100 mL IVPB  10 mEq Intravenous Q1 Hr x 4 Marjan Rabbani, MD 100 mL/hr at 08/31/15 1813 10 mEq at 08/31/15 1813  . [START ON 09/01/2015] pravastatin (PRAVACHOL) tablet 20 mg  20 mg Oral Daily Marjan Rabbani, MD      . predniSONE (DELTASONE) tablet 20 mg  20 mg Oral Q breakfast Marjan Rabbani, MD      . risperiDONE (RISPERDAL) tablet 0.5 mg  0.5 mg Oral Daily Marjan Rabbani, MD      . risperiDONE (RISPERDAL) tablet 1 mg  1 mg Oral BID Marjan Rabbani, MD      . senna-docusate (Senokot-S) tablet 1 tablet   1 tablet Oral QHS PRN Marjan Rabbani, MD      . sodium chloride flush (NS) 0.9 % injection 3 mL  3 mL Intravenous Q12H Marjan Rabbani, MD      . tamsulosin (FLOMAX) capsule 0.4 mg  0.4 mg Oral QHS Otis Brace, MD       Current Outpatient Prescriptions  Medication Sig Dispense Refill  . Calcium Carbonate-Vitamin D (CALCARB 600/D) 600-400 MG-UNIT per tablet Take 1 tablet by mouth daily.     . divalproex (DEPAKOTE) 125 MG DR tablet Take 250-375 mg by mouth 2 (two) times daily. Take 2 tablets (250 mg) in the am & Take 3 tablets (375 mg) in the pm.    . folic acid (FOLVITE) 1 MG tablet Take 1 mg by mouth daily.    . furosemide (LASIX) 20 MG tablet Take 20 mg by mouth daily.    Marland Kitchen guaiFENesin (MUCINEX) 600 MG 12 hr tablet Take 600 mg by mouth 2 (two) times daily as needed for cough or to loosen phlegm.    . Oxcarbazepine (TRILEPTAL) 300 MG tablet Take 300 mg by mouth 2 (two) times daily.    Marland Kitchen oxybutynin (DITROPAN) 5 MG tablet Take 10 mg by mouth 3 (three) times daily. At 8 am, 2 pm & 8 pm.    . pantoprazole (PROTONIX) 40 MG tablet Take 40 mg by mouth daily.    . polyethylene glycol powder (MIRALAX) powder Take 17 g by mouth daily.    . pravastatin (PRAVACHOL) 20 MG tablet Take 20 mg by mouth daily. 5 PM    . predniSONE (DELTASONE) 5 MG tablet Take 5 mg by mouth daily with breakfast.    . risperiDONE (RISPERDAL) 0.5 MG tablet Take 0.5 mg by mouth daily. At 4 pm.    . risperiDONE (RISPERDAL) 1 MG tablet Take 1 mg by mouth 2 (two) times daily. At 8 am & 8 pm.    . tamsulosin (FLOMAX) 0.4 MG CAPS capsule Take 0.4 mg by mouth at bedtime.      Allergies: Allergies as of 08/31/2015  . (No Known Allergies)   Past Medical History  Diagnosis Date  . Autism   . PTSD (post-traumatic stress disorder)   . Bipolar disorder, unspecified (HCC)   . Profound mental retardation   .  Seizure disorder (HCC)   . Rheumatoid arteritis   . Constipation   . Incontinence of bowel   . Bladder incontinence     Past Surgical History  Procedure Laterality Date  . No past surgeries     Family History  Problem Relation Age of Onset  . Family history unknown: Yes   Social History   Social History  . Marital Status: Single    Spouse Name: N/A  . Number of Children: N/A  . Years of Education: N/A   Occupational History  . Not on file.   Social History Main Topics  . Smoking status: Never Smoker   . Smokeless tobacco: Never Used  . Alcohol Use: No  . Drug Use: No  . Sexual Activity: Not on file   Other Topics Concern  . Not on file   Social History Narrative    Review of Systems: Constitutional: Weakness and malaise Ears, nose, mouth, throat, and face: nasal congestion Respiratory: positive for cough and sputum Gastrointestinal: no change in bowel habits Genitourinary: decreased frequency of urination Musculoskeletal: muscle weakness Neurological: positive for seizures and speech problems  Physical Exam: Blood pressure 109/59, pulse 70, temperature 99.8 F (37.7 C), temperature source Oral, resp. rate 19, height  (1.575 m), weight 66.679 kg (147 lb), SpO2 95 %. General appearance: Asleep during most of the history, awake and coughing for part, nonverbal Lungs: rhonchi on the anterior lung fields bilateral on expiration Heart: regular rate and rhythm and S1, S2 normal Abdomen: bowel sounds normal Extremities: symmetric 1+ edema to mid shin Skin: warm and dry Lymph nodes: No cervical lymphadenopathy  Lab results: Basic Metabolic Panel:  Recent Labs  16/10/96 1135  NA 136  K 3.2*  CL 100*  CO2 19*  GLUCOSE 95  BUN 14  CREATININE 1.05  CALCIUM 9.1   Liver Function Tests:  Recent Labs  08/31/15 1135  AST 32  ALT 13*  ALKPHOS 43  BILITOT 0.9  PROT 6.1*  ALBUMIN 2.9*   CBC:  Recent Labs  08/31/15 1135  WBC 10.8*  NEUTROABS 8.2*  HGB 13.6  HCT 38.6*  MCV 90.6  PLT 120*   CBG:  Recent Labs  08/31/15 1823  GLUCAP 87    Urinalysis:  Recent Labs  08/31/15 1220  COLORURINE YELLOW  LABSPEC 1.013  PHURINE 6.0  GLUCOSEU NEGATIVE  HGBUR NEGATIVE  BILIRUBINUR NEGATIVE  KETONESUR 15*  PROTEINUR NEGATIVE  NITRITE NEGATIVE  LEUKOCYTESUR NEGATIVE   Misc. Labs: Flu PCR in process  Imaging results:  Dg Chest 2 View  08/31/2015  CLINICAL DATA:  75 year old male with a history of cough and weakness EXAM: CHEST - 2 VIEW COMPARISON:  08/31/2015 CT 03/04/2015 FINDINGS: Cardiomediastinal silhouette unchanged. Low lung volumes. Interval development of left greater than right airspace disease and interstitial opacities. No pneumothorax. Nodule identified at the left base on chest CT 03/04/2015, not well visualized on the current study. Osteopenia. Configuration of vertebral bodies is unchanged from comparison CT. Unremarkable appearance of the upper abdomen. IMPRESSION: Interval development of multifocal pneumonia, worst on the left. Followup PA and lateral chest X-ray is recommended in 3-4 weeks following trial of antibiotic therapy to ensure resolution and exclude underlying malignancy. Signed, Yvone Neu. Loreta Ave, DO [facility] Radiology Electronically Signed   By: Gilmer Mor D.O.   On: 08/31/2015 13:43   Ct Head Wo Contrast  08/31/2015  CLINICAL DATA:  Lethargy/altered mental status.  History of seizures EXAM: CT HEAD WITHOUT CONTRAST  TECHNIQUE: Contiguous axial images were obtained from the base of the skull through the vertex without intravenous contrast. COMPARISON:  March 04, 2015 FINDINGS: Mild diffuse atrophy is stable. There is no intracranial mass, hemorrhage, extra-axial fluid collection, or midline shift. There is patchy small vessel disease in the centra semiovale bilaterally, stable. No acute infarct identified. The bony calvarium appears intact. The mastoid air cells are clear. No intraorbital lesions are identified. There is mucosal thickening in the  maxillary antra bilaterally as well as in several ethmoid air cells bilaterally. IMPRESSION: Mild atrophy with stable patchy periventricular small vessel disease. No acute infarct evident. No hemorrhage or mass effect. Areas of paranasal sinus disease noted. Electronically Signed   By: Bretta Bang III M.D.   On: 08/31/2015 11:16   Dg Chest Port 1 View  08/31/2015  CLINICAL DATA:  Altered mental status.  History of seizure. EXAM: PORTABLE CHEST 1 VIEW COMPARISON:  Single view of the chest 05/23/2015 and CT chest 03/04/2015. PA and lateral chest 03/03/2015. FINDINGS: The patient is rotated on the study. Lung volumes are low with basilar atelectasis. No consolidative process, pneumothorax or effusion is identified. Heart size is normal. IMPRESSION: No acute disease. Electronically Signed   By: Drusilla Kanner M.D.   On: 08/31/2015 11:13    Other results: EKG: sinus rhythm  Assessment & Plan by Problem: CAP: Cough and sputum production for 3 days, now with 1 day of hypotension. Multifocal pneumonia on CXR. He has previously been hospitalized for pneumonia in 12/2012 and 02/2015, too. His pneumonia could be due to aspiration or chronic prednisone use. Flu in process. -IVF and maintenance fluids -Azithromycin 250 mg IV daily -Ceftriaxone 1g IV daily -Tessalon -Repeat CBC, BMP  RA: Regularly takes 5 mg prednisone daily. Increasing dose for stress dose. -prednisone 20  Mental retardation, autism Nonverbal which is normal for him -Risperdal  Seizure disorder: No seizures witnessed, no recent seizures -Depakote BID -Oxcarbazepine 300 mgBID   This is a Psychologist, occupational Note.  The care of the patient was discussed with Dr. Dimple Casey and the assessment and plan was formulated with their assistance.  Please see their note for official documentation of the patient encounter.   Signed: Doreene Burke, Med Student 08/31/2015, 7:33 PM

## 2015-08-31 NOTE — ED Notes (Signed)
Pt. Coming from home via [facility] because of altered mental status. Pt. Last seen normal over 12 hours ago when home health nurse put him to bed. Pt. Also noted to lean towards left side. Pt. Hx of MR, Autism, Bipolar, seizures, and recent hx of pneumonia. Pt. Caregiver reported to EMS that pt. Is lethargic, nonverbal, and does not follow commands, which is not his normal. Pt. Vitals en route 108 mg/dL CBG, 830/94 mmHg, 07% o2 sat. on room air, 18 RR, 91 bpm regular, and 99.1 oral temp.

## 2015-08-31 NOTE — ED Notes (Signed)
Attempted to call report, unsuccessful

## 2015-08-31 NOTE — ED Notes (Signed)
Caregiver  Gave Pt. Applesauce. Pt. Requires assistance when eating

## 2015-08-31 NOTE — ED Provider Notes (Signed)
CSN: [id number]     Arrival date & time 08/31/15  1017 History   First MD Initiated Contact with Patient 08/31/15 1025     No chief complaint on file.    (Consider location/radiation/quality/duration/timing/severity/associated sxs/prior Treatment) HPI Comments: Patient is a 75 year old male with a history of autism, bipolar disease, profound mental retardation, seizure disorder who presents from home via EMS. There is currently no caregiver present and patient is unable to answer any of my questions but EMS reports that patient was brought in for altered mental status. He was last seen normal over 12 hours ago when the home health nurse put him to bed. They noted this morning that he was leaning towards the left and had recently been diagnosed with pneumonia. There was also report of him being lethargic and unable to follow commands and being nonverbal which is not his baseline. He also was noted to have a cough on exam.  The history is provided by the EMS personnel. The history is limited by the absence of a caregiver.    Past Medical History  Diagnosis Date  . Autism   . PTSD (post-traumatic stress disorder)   . Bipolar disorder, unspecified (HCC)   . Profound mental retardation   . Seizure disorder (HCC)   . Rheumatoid arteritis   . Constipation   . Incontinence of bowel   . Bladder incontinence    Past Surgical History  Procedure Laterality Date  . No past surgeries     Family History  Problem Relation Age of Onset  . Family history unknown: Yes   Social History  Substance Use Topics  . Smoking status: Never Smoker   . Smokeless tobacco: Never Used  . Alcohol Use: No    Review of Systems  Unable to perform ROS: Mental status change      Allergies  Review of patient's allergies indicates no known allergies.  Home Medications   Prior to Admission medications   Medication Sig Start Date End Date Taking? Authorizing Provider  Calcium Carbonate-Vitamin D (CALCARB  600/D) 600-400 MG-UNIT per tablet Take 1 tablet by mouth daily.     Historical Provider, MD  divalproex (DEPAKOTE SPRINKLE) 125 MG capsule Take 4 capsules (500 mg total) by mouth daily. Patient taking differently: Take 500-625 mg by mouth daily. Take 4 tablets= 500mg  in the AM and then take 5 tablets= 625 mg at [facility] 01/01/13   01/03/13, MD  divalproex (DEPAKOTE) 125 MG DR tablet Take 500-625 mg by mouth 2 (two) times daily. Take 4 tablets (500 mg) in the am & Take 5 tablets (625 mg) in the pm.    Historical Provider, MD  feeding supplement (ENSURE) PUDG Take 1 Container by mouth 3 (three) times daily between meals. Patient not taking: Reported on 03/03/2015 01/01/13   01/03/13, MD  folic acid (FOLVITE) 1 MG tablet Take 1 mg by mouth daily.    Historical Provider, MD  furosemide (LASIX) 20 MG tablet Take 20 mg by mouth daily.    Historical Provider, MD  levofloxacin (LEVAQUIN) 750 MG tablet Take 1 tablet (750 mg total) by mouth daily. Patient not taking: Reported on 05/23/2015 03/05/15   03/07/15, MD  Oxcarbazepine (TRILEPTAL) 300 MG tablet Take 300 mg by mouth 2 (two) times daily.    Historical Provider, MD  oxybutynin (DITROPAN) 5 MG tablet Take 10 mg by mouth 3 (three) times daily. At 8 am, 2 pm & 8 pm.    Historical Provider, MD  pantoprazole (PROTONIX)  20 MG tablet Take 1 tablet (20 mg total) by mouth daily. Patient not taking: Reported on 03/03/2015 12/26/12   [name], MD  pantoprazole (PROTONIX) 40 MG tablet Take 40 mg by mouth daily.    Historical Provider, MD  polyethylene glycol powder (MIRALAX) powder Take 17 g by mouth daily.    Historical Provider, MD  pravastatin (PRAVACHOL) 20 MG tablet Take 20 mg by mouth daily.    Historical Provider, MD  predniSONE (DELTASONE) 5 MG tablet Take 5 mg by mouth daily with breakfast.    Historical Provider, MD  risperiDONE (RISPERDAL) 0.5 MG tablet Take 0.5 mg by mouth daily. At 4 pm.    Historical Provider, MD  risperiDONE (RISPERDAL) 1  MG tablet Take 1 mg by mouth 2 (two) times daily. At 8 am & 8 pm.    Historical Provider, MD  tamsulosin (FLOMAX) 0.4 MG CAPS capsule Take 0.4 mg by mouth at bedtime.    Historical Provider, MD   BP 116/56 mmHg  Pulse 86  Temp(Src) 100.2 F (37.9 C) (Rectal)  Resp 21  SpO2 93% Physical Exam  Constitutional: He appears well-developed and well-nourished. No distress.  HENT:  Head: Normocephalic and atraumatic.  Patient is refusing to open his mouth  Eyes: Conjunctivae and EOM are normal. Pupils are equal, round, and reactive to light.  Neck: Normal range of motion. Neck supple.  Cardiovascular: Normal rate, regular rhythm and intact distal pulses.   No murmur heard. Pulmonary/Chest: Effort normal. Tachypnea noted. No respiratory distress. He has no wheezes. He has rhonchi in the left lower field. He has no rales.  Abdominal: Soft. He exhibits no distension. There is no tenderness. There is no rebound and no guarding.  Genitourinary:  Bilateral testicular edema without tenderness or erythema  Musculoskeletal: Normal range of motion. He exhibits no edema or tenderness.  Neurological:  Patient only mumbles and will not say any words. He will not follow any commands. Patient does move upper extremities but unable to discern if he has a pronator drift or deficit. He is leaning towards the left in bed but no notable facial droop. Pupils are reactive bilaterally. Patient currently is not speaking  Skin: Skin is warm and dry. No rash noted. No erythema.  Psychiatric: He has a normal mood and affect. His behavior is normal.  Nursing note and vitals reviewed.   ED Course  Procedures (including critical care time) Labs Review Labs Reviewed  CBC WITH DIFFERENTIAL/PLATELET - Abnormal; Notable for the following:    WBC 10.8 (*)    HCT 38.6 (*)    Platelets 120 (*)    Neutro Abs 8.2 (*)    Monocytes Absolute 1.9 (*)    All other components within normal limits  URINALYSIS, ROUTINE W REFLEX  MICROSCOPIC (NOT AT [facility]) - Abnormal; Notable for the following:    Ketones, ur 15 (*)    All other components within normal limits  URINE CULTURE  COMPREHENSIVE METABOLIC PANEL  VALPROIC ACID LEVEL  I-STAT CG4 LACTIC ACID, ED    Imaging Review Dg Chest 2 View  08/31/2015  CLINICAL DATA:  75 year old male with a history of cough and weakness EXAM: CHEST - 2 VIEW COMPARISON:  08/31/2015 CT 03/04/2015 FINDINGS: Cardiomediastinal silhouette unchanged. Low lung volumes. Interval development of left greater than right airspace disease and interstitial opacities. No pneumothorax. Nodule identified at the left base on chest CT 03/04/2015, not well visualized on the current study. Osteopenia. Configuration of vertebral bodies is unchanged from comparison CT.  Unremarkable appearance of the upper abdomen. IMPRESSION: Interval development of multifocal pneumonia, worst on the left. Followup PA and lateral chest X-ray is recommended in 3-4 weeks following trial of antibiotic therapy to ensure resolution and exclude underlying malignancy. Signed, Yvone Neu. Loreta Ave, DO [facility] Radiology Electronically Signed   By: Gilmer Mor D.O.   On: 08/31/2015 13:43   Ct Head Wo Contrast  08/31/2015  CLINICAL DATA:  Lethargy/altered mental status.  History of seizures EXAM: CT HEAD WITHOUT CONTRAST TECHNIQUE: Contiguous axial images were obtained from the base of the skull through the vertex without intravenous contrast. COMPARISON:  March 04, 2015 FINDINGS: Mild diffuse atrophy is stable. There is no intracranial mass, hemorrhage, extra-axial fluid collection, or midline shift. There is patchy small vessel disease in the centra semiovale bilaterally, stable. No acute infarct identified. The bony calvarium appears intact. The mastoid air cells are clear. No intraorbital lesions are identified. There is mucosal thickening in the maxillary antra bilaterally as well as in  several ethmoid air cells bilaterally. IMPRESSION: Mild atrophy with stable patchy periventricular small vessel disease. No acute infarct evident. No hemorrhage or mass effect. Areas of paranasal sinus disease noted. Electronically Signed   By: Bretta Bang III M.D.   On: 08/31/2015 11:16   Dg Chest Port 1 View  08/31/2015  CLINICAL DATA:  Altered mental status.  History of seizure. EXAM: PORTABLE CHEST 1 VIEW COMPARISON:  Single view of the chest 05/23/2015 and CT chest 03/04/2015. PA and lateral chest 03/03/2015. FINDINGS: The patient is rotated on the study. Lung volumes are low with basilar atelectasis. No consolidative process, pneumothorax or effusion is identified. Heart size is normal. IMPRESSION: No acute disease. Electronically Signed   By: Drusilla Kanner M.D.   On: 08/31/2015 11:13   I have personally reviewed and evaluated these images and lab results as part of my medical decision-making.   EKG Interpretation   Date/Time:  Wednesday August 31 2015 10:19:51 EDT Ventricular Rate:  90 PR Interval:  128 QRS Duration: 95 QT Interval:  351 QTC Calculation: 429 R Axis:   -56 Text Interpretation:  Sinus rhythm Left anterior fascicular block No  significant change since last tracing Confirmed by Anitra Lauth  MD, Alphonzo Lemmings  [phone]) on 08/31/2015 10:23:47 AM      MDM   Final diagnoses:  CAP (community acquired pneumonia)    Patient is a 75 year old male with a seizure disorder history, profound MR, bipolar disease and autism who lives at home with a caregiver and presents today by EMS who were new onset lethargy, nonverbal, possibly leaning towards the left. Per EMS report he was recently diagnosed with pneumonia. Currently the caregiver is not present and unable to obtain any further history. On exam patient is febrile rectally to 100.2. He does have a cough noted on exam. Coarse breath sounds and rhonchi present in the left lower lobe. O2 sats between 93-95% on room air. Unable to do  a thorough neurologic exam given patient is not following commands. He resists when I attempt to straighten or move his arms or legs. No evidence of cellulitis or abdominal discomfort.  Concern patient's altered mental status is related to infectious etiology such as pneumonia versus UTI. Also concern for potential stroke or intracranial bleed. EKG without significant findings.  CBC, CMP, UA, urine culture, lactic acid, valproic acid level, head CT and chest x-ray pending. Patient given a 500 mL bolus of fluid.  11:44 AM Head CT and CXR without  acute findings however CXR is poor quality and caregiver has arrived and states that he has had a worsening cough over the last 3-4 days. They spoke with his doctor yesterday and they started giving him Mucinex however today he has no energy, generalized weakness, poor by mouth intake and she states that exactly what happens every time he is ill. Unknown if he had a flu shot this year. Influenza PCR sent. Patient will be started on Rocephin and azithromycin for community-acquired pneumonia. 2:03 PM Two-view chest revealed a multifocal pneumonia. Patient started on Rocephin and azithromycin. Patient will be admitted.  Gwyneth Sprout, MD 08/31/15 1421

## 2015-09-01 DIAGNOSIS — J1108 Influenza due to unidentified influenza virus with specified pneumonia: Secondary | ICD-10-CM

## 2015-09-01 DIAGNOSIS — Z7952 Long term (current) use of systemic steroids: Secondary | ICD-10-CM

## 2015-09-01 DIAGNOSIS — R600 Localized edema: Secondary | ICD-10-CM

## 2015-09-01 DIAGNOSIS — J101 Influenza due to other identified influenza virus with other respiratory manifestations: Secondary | ICD-10-CM | POA: Diagnosis present

## 2015-09-01 DIAGNOSIS — M069 Rheumatoid arthritis, unspecified: Secondary | ICD-10-CM

## 2015-09-01 LAB — BASIC METABOLIC PANEL
Anion gap: 7 (ref 5–15)
BUN: 7 mg/dL (ref 6–20)
CO2: 23 mmol/L (ref 22–32)
CREATININE: 0.77 mg/dL (ref 0.61–1.24)
Calcium: 8.3 mg/dL — ABNORMAL LOW (ref 8.9–10.3)
Chloride: 111 mmol/L (ref 101–111)
GFR calc Af Amer: >60 mL/min (ref >60)
GLUCOSE: 119 mg/dL — AB (ref 65–99)
POTASSIUM: 3.4 mmol/L — AB (ref 3.5–5.1)
SODIUM: 141 mmol/L (ref 135–145)

## 2015-09-01 LAB — CBC WITH DIFFERENTIAL/PLATELET
Basophils Absolute: 0 10*3/uL (ref 0.0–0.1)
Basophils Relative: 0 %
EOS ABS: 0 10*3/uL (ref 0.0–0.7)
EOS PCT: 0 %
HCT: 36.6 % — ABNORMAL LOW (ref 39.0–52.0)
Hemoglobin: 12.7 g/dL — ABNORMAL LOW (ref 13.0–17.0)
LYMPHS ABS: 0.7 10*3/uL (ref 0.7–4.0)
Lymphocytes Relative: 6 %
MCH: 31.6 pg (ref 26.0–34.0)
MCHC: 34.7 g/dL (ref 30.0–36.0)
MCV: 91 fL (ref 78.0–100.0)
MONO ABS: 0.6 10*3/uL (ref 0.1–1.0)
Monocytes Relative: 5 %
NEUTROS PCT: 89 %
Neutro Abs: 10 10*3/uL — ABNORMAL HIGH (ref 1.7–7.7)
PLATELETS: 116 10*3/uL — AB (ref 150–400)
RBC: 4.02 MIL/uL — ABNORMAL LOW (ref 4.22–5.81)
RDW: 14.2 % (ref 11.5–15.5)
WBC: 11.3 10*3/uL — AB (ref 4.0–10.5)

## 2015-09-01 LAB — INFLUENZA PANEL BY PCR (TYPE A & B)
H1N1 flu by pcr: NOT DETECTED
INFLAPCR: NEGATIVE
Influenza B By PCR: POSITIVE — AB

## 2015-09-01 LAB — URINE CULTURE: Culture: NO GROWTH

## 2015-09-01 LAB — MRSA PCR SCREENING: MRSA by PCR: POSITIVE — AB

## 2015-09-01 LAB — STREP PNEUMONIAE URINARY ANTIGEN: Strep Pneumo Urinary Antigen: NEGATIVE

## 2015-09-01 LAB — MAGNESIUM: MAGNESIUM: 1.6 mg/dL — AB (ref 1.7–2.4)

## 2015-09-01 MED ORDER — OSELTAMIVIR PHOSPHATE 75 MG PO CAPS
75.0000 mg | ORAL_CAPSULE | Freq: Two times a day (BID) | ORAL | Status: DC
  Administered 2015-09-01 – 2015-09-02 (×3): 75 mg via ORAL
  Filled 2015-09-01 (×5): qty 1

## 2015-09-01 MED ORDER — POTASSIUM CHLORIDE CRYS ER 20 MEQ PO TBCR
40.0000 meq | EXTENDED_RELEASE_TABLET | Freq: Once | ORAL | Status: AC
  Administered 2015-09-01: 40 meq via ORAL
  Filled 2015-09-01: qty 2

## 2015-09-01 MED ORDER — OSELTAMIVIR PHOSPHATE 30 MG PO CAPS
30.0000 mg | ORAL_CAPSULE | Freq: Two times a day (BID) | ORAL | Status: DC
  Administered 2015-09-01: 30 mg via ORAL
  Filled 2015-09-01 (×2): qty 1

## 2015-09-01 MED ORDER — OSELTAMIVIR PHOSPHATE 75 MG PO CAPS
75.0000 mg | ORAL_CAPSULE | Freq: Two times a day (BID) | ORAL | Status: DC
  Filled 2015-09-01: qty 1

## 2015-09-01 MED ORDER — PREDNISONE 5 MG PO TABS
5.0000 mg | ORAL_TABLET | Freq: Every day | ORAL | Status: DC
  Administered 2015-09-02: 5 mg via ORAL
  Filled 2015-09-01: qty 1

## 2015-09-01 MED ORDER — MAGNESIUM SULFATE 2 GM/50ML IV SOLN
2.0000 g | Freq: Once | INTRAVENOUS | Status: AC
  Administered 2015-09-01: 2 g via INTRAVENOUS
  Filled 2015-09-01: qty 50

## 2015-09-01 MED ORDER — CHLORHEXIDINE GLUCONATE CLOTH 2 % EX PADS
6.0000 | MEDICATED_PAD | Freq: Every day | CUTANEOUS | Status: DC
  Administered 2015-09-01 – 2015-09-02 (×2): 6 via TOPICAL

## 2015-09-01 MED ORDER — PREDNISONE 20 MG PO TABS
20.0000 mg | ORAL_TABLET | Freq: Every day | ORAL | Status: DC
  Administered 2015-09-01: 20 mg via ORAL
  Filled 2015-09-01: qty 1

## 2015-09-01 MED ORDER — MUPIROCIN 2 % EX OINT
1.0000 "application " | TOPICAL_OINTMENT | Freq: Two times a day (BID) | CUTANEOUS | Status: DC
  Administered 2015-09-01 – 2015-09-02 (×4): 1 via NASAL
  Filled 2015-09-01: qty 22

## 2015-09-01 MED ORDER — CETYLPYRIDINIUM CHLORIDE 0.05 % MT LIQD
7.0000 mL | Freq: Two times a day (BID) | OROMUCOSAL | Status: DC
  Administered 2015-09-01 – 2015-09-02 (×3): 7 mL via OROMUCOSAL

## 2015-09-01 NOTE — Progress Notes (Signed)
  Subjective: [name] had no overnight events. He is awake and much more interactive today, making eye contact and repeating phrases. His caregiver commented that he is at his baseline activity level and affect.  Objective: BP 110/58 mmHg  Pulse 71  Temp(Src) 98.6 F (37 C) (Oral)  Resp 14  Ht 5\' 2"  (1.575 m)  Wt 72.5 kg (159 lb 13.3 oz)  BMI 29.23 kg/m2  SpO2 97% General appearance: alert, no distress and interactive but nonverbal Lungs: clear to auscultation bilaterally Heart: regular rate and rhythm and S1, S2 normal Extremities: 1+ pitting edema on the shins bilaterally  Labs: Flu B positive MRSA nasal swab positive Urine culture negative Strep pneumo urine antigen negative  Assessment/Plan: Influenza B Pulmonary symptoms are most likely flu symptoms with his positive flu PCR. Mr. Mcconaghy is a high risk flu patient because he is older than 6. He's been started on tamiflu which he will take for 5 days. He did not present within 48 hours of the start of his symptoms but because of his high risk status the tamiflu was started anyway.  Seizure disorder: SLP evaluated [name] today and determined that he has a mild risk for aspiration, recommended a dysphagia 3 diet and 24 hour supervision while eating.   This is a 78 Note.  The care of the patient was discussed with Dr. Psychologist, occupational and the assessment and plan formulated with their assistance.  Please see their attached note for official documentation of the daily encounter.   LOS: 1 day   Dimple Casey, Med Student 09/01/2015, 1:11 PM

## 2015-09-01 NOTE — Progress Notes (Signed)
Admitted pt.from ED,alert but UTA orientation bec.pt.has HX of autism & Mental Retardation.Pt.is not in acute distress.V/S taken & recorded.IV in place on left wrist w/ occlusive dsd.intact.no redness or swelling noted the patient.is incontinent of bowel large amt.kept clean & dry.& made comfortable in bed.Inpt.armband ID applied.Placed pt.on heart monitor & verified with central tele.monitor tech.Placed pt.on bed alarm since pt.is unable to call for help & placed pt.on fall precs.Will continue to monitor pt.

## 2015-09-01 NOTE — Care Management Note (Addendum)
Case Management Note  Patient Details  Name: HANNAH CRILL MRN: [id number] Date of Birth: [birth year]  Subjective/Objective:                 Patient with history MR from group home admitted with PNA, AMS. IV Abx. CSW consulted.   Action/Plan:  Will continue to follow Addendum 3-17 Patient DC to Group Home as facilitated through CSW. No HH needed.  Expected Discharge Date:                  Expected Discharge Plan:  Group Home  In-House Referral:  Clinical Social Work  Discharge planning Services  CM Consult  Post Acute Care Choice:    Choice offered to:     DME Arranged:    DME Agency:     HH Arranged:    HH Agency:     Status of Service:  In process, will continue to follow  Medicare Important Message Given:    Date Medicare IM Given:    Medicare IM give by:    Date Additional Medicare IM Given:    Additional Medicare Important Message give by:     If discussed at Long Length of Stay Meetings, dates discussed:    Additional Comments:  Lawerance Sabal, RN 09/01/2015, 2:12 PM

## 2015-09-01 NOTE — Progress Notes (Signed)
Internal Medicine Attending:   I saw and examined the patient. I reviewed the resident's note and I agree with the resident's findings and plan as documented in the resident's note.  75 year old man admitted with pneumonia due to influenza B. He is currently doing well, saturating well on room air. Plan is to continue Tamiflu for 5 day course. I agree with empiric antibiotic coverage given his large infiltrate on chest x-ray and relative instability yesterday.

## 2015-09-01 NOTE — Evaluation (Signed)
Clinical/Bedside Swallow Evaluation Patient Details  Name: Alan Rhodes MRN: [id number] Date of Birth: 1940-11-11  Today's Date: 09/01/2015 Time: SLP Start Time (ACUTE ONLY): 8921 SLP Stop Time (ACUTE ONLY): 0839 SLP Time Calculation (min) (ACUTE ONLY): 22 min  Past Medical History:  Past Medical History  Diagnosis Date  . Autism   . PTSD (post-traumatic stress disorder)   . Bipolar disorder, unspecified (HCC)   . Profound mental retardation   . Seizure disorder (HCC)   . Rheumatoid arteritis   . Constipation   . Incontinence of bowel   . Bladder incontinence    Past Surgical History:  Past Surgical History  Procedure Laterality Date  . No past surgeries     HPI:  75 y.o. male with h/o profound MR, autism, bipolar disorder, rheumatoid arthritis, urinary incontinence and PTSD, who presented to ED due to weakness, fever and non-productive wet sounding cough for 4 days PTA observed by home health aide. CT Head 3/15 no acute infarct evident, no hemorrhage or mass effect. CXR 3/15 intervel development of multifocal pneumonia, worst on L.    Assessment / Plan / Recommendation Clinical Impression  Pt exhibited oral holding, impaired mastication and immediate coughing x1 during intake of solids, however this is believed to be due to cognitive deficits rather than musculature. No overt s/s of penetration/aspiration observed during intake of thin liquids and purees. Discussed with RN diet recommendation of Dysphagia 3 (mechanical soft) diet and thin liquids (straws allowed), with meds whole in puree, with full supervision and assistance during mealtime. SLP will f/u x1 to determine diet tolerance.    Aspiration Risk  Mild aspiration risk    Diet Recommendation Dysphagia 3 (Mech soft);Thin liquid   Liquid Administration via: Cup;Straw Medication Administration: Whole meds with puree Supervision: Full supervision/cueing for compensatory strategies;Staff to assist with self  feeding Compensations: Minimize environmental distractions;Slow rate;Small sips/bites Postural Changes: Seated upright at 90 degrees    Other  Recommendations Oral Care Recommendations: Oral care BID   Follow up Recommendations  24 hour supervision/assistance    Frequency and Duration min 1 x/week  1 week       Prognosis Prognosis for Safe Diet Advancement: Good Barriers to Reach Goals: Cognitive deficits;Behavior      Swallow Study   General HPI: 75 y.o. male with h/o profound MR, autism, bipolar disorder, rheumatoid arthritis, urinary incontinence and PTSD, who presented to ED due to weakness, fever and non-productive wet sounding cough for 4 days PTA observed by home health aide. CT Head 3/15 no acute infarct evident, no hemorrhage or mass effect. CXR 3/15 intervel development of multifocal pneumonia, worst on L.  Type of Study: Bedside Swallow Evaluation Previous Swallow Assessment: BSE 02/2015 recommended regular diet and thin liquids Diet Prior to this Study: Regular;Thin liquids Temperature Spikes Noted: Yes Respiratory Status: Room air History of Recent Intubation: No Behavior/Cognition: Alert;Cooperative;Pleasant mood;Confused;Requires cueing Oral Cavity Assessment: Within Functional Limits Oral Care Completed by SLP: No Oral Cavity - Dentition: Adequate natural dentition Vision: Functional for self-feeding Self-Feeding Abilities: Total assist Patient Positioning: Upright in bed Baseline Vocal Quality: Normal Volitional Cough: Strong Volitional Swallow: Able to elicit    Oral/Motor/Sensory Function Overall Oral Motor/Sensory Function: Within functional limits   Ice Chips Ice chips: Not tested   Thin Liquid Thin Liquid: Within functional limits Presentation: Cup;Straw    Nectar Thick Nectar Thick Liquid: Not tested   Honey Thick Honey Thick Liquid: Not tested   Puree Puree: Within functional limits Presentation: Spoon   Solid  GO   Solid: Impaired Oral Phase  Impairments: Impaired mastication Oral Phase Functional Implications: Oral holding;Impaired mastication Pharyngeal Phase Impairments: Cough - Immediate        Alan Rhodes 09/01/2015,9:01 AM  Lynita Lombard, Student-SLP

## 2015-09-01 NOTE — Progress Notes (Signed)
Subjective: Influenza B PCR positive overnight and started on tamiflu. Patient is much more alert this morning sitting up in bed and awake. He still does not follow commands well. He had no more fever overnight and blood pressures remained higher after fluids resuscitation yesterday evening.  Objective: Vital signs in last 24 hours: Filed Vitals:   08/31/15 2309 09/01/15 0500 09/01/15 0513 09/01/15 1417  BP: 101/57  127/68 110/58  Pulse: 64  74 71  Temp: 98.1 F (36.7 C)  98.2 F (36.8 C) 98.6 F (37 C)  TempSrc: Axillary  Axillary Oral  Resp: 17  16 14   Height:      Weight:  72.5 kg (159 lb 13.3 oz)    SpO2: 100%  97% 97%   Weight change:   Intake/Output Summary (Last 24 hours) at 09/01/15 1504 Last data filed at 09/01/15 0800  Gross per 24 hour  Intake 1162.5 ml  Output      0 ml  Net 1162.5 ml   GENERAL- awake spontaneously, nonverbal, NAD CARDIAC- RRR, no murmurs, rubs or gallops. RESP- CTAB without crackles or wheezes, fair air movement throughout but not taking deep inspirations when prompted ABDOMEN- Soft, nontender to palpation NEURO- Unable to assess 2/2 patient mental status EXTREMITIES- symmetric, trace pedal edema SKIN- Warm, dry, No rash or lesion. PSYCH- noncooperative, awake spontaneously, grunting intermittently  Medications: I have reviewed the patient's current medications. Scheduled Meds: . acetaminophen  650 mg Oral Once  . antiseptic oral rinse  7 mL Mouth Rinse BID  . azithromycin  250 mg Intravenous Q24H  . benzonatate  100 mg Oral BID  . cefTRIAXone (ROCEPHIN)  IV  1 g Intravenous Q24H  . Chlorhexidine Gluconate Cloth  6 each Topical Q0600  . divalproex  250 mg Oral Daily  . divalproex  375 mg Oral QHS  . enoxaparin (LOVENOX) injection  40 mg Subcutaneous Q24H  . folic acid  1 mg Oral Daily  . mupirocin ointment  1 application Nasal BID  . oseltamivir  75 mg Oral BID  . Oxcarbazepine  300 mg Oral BID  . oxybutynin  10 mg Oral TID  .  pantoprazole  40 mg Oral Daily  . pravastatin  20 mg Oral q1800  . predniSONE  20 mg Oral Q breakfast  . risperiDONE  0.5 mg Oral Q24H  . risperiDONE  1 mg Oral BID  . sodium chloride flush  3 mL Intravenous Q12H  . tamsulosin  0.4 mg Oral QHS   Continuous Infusions:  PRN Meds:.acetaminophen **OR** acetaminophen, ondansetron **OR** ondansetron (ZOFRAN) IV, senna-docusate Assessment/Plan: Community acquired pneumonia 2/2 influenza: Influenza B positive PCR. Strep pneumo urine Ag negative. Rapid improvement overnight. Patient started on tamiflu due to unknown exact duration of preceding illness and severity of his presentation with hypotension down to 80s/40s, weakness, and altered mental status before improving with IVF boluses. Similarly I will continue abtx for 3-5 days depending on clinical progress in case of superimposed bacterial infection. On review, CXR mostly LLL predominant consolidation unlikely caused by aspiration event. -Azithromycin 250mg  3/15>>> -Ceftriaxone 1g 3/15>>> -Advance diet to dysphagia 3 per speech eval  Rheumatoid arthritis: Gave 20mg  prednisone once, will decrease back to home dose 5mg  since large clinical improvement and maintaining BP now.  Chronic lower extremity edema: hold diuretics foy hypotension. On lasix 20mg  PTA. Received a total of ~3L of crystalloids since admission with hypotension. -Resume diuretics tomorrow if tolerating PO intake and good vitals  Diet: NPO VTE ppx: [place] lovenox FULL  CODE  Dispo: Patient has made great clinical progress over the past day and is likely to be appropriate for discharge back to group home tomorrow if this continues.   LOS: 1 day   Fuller Plan, MD 09/01/2015, 3:04 PM

## 2015-09-02 LAB — BASIC METABOLIC PANEL
Anion gap: 7 (ref 5–15)
BUN: 8 mg/dL (ref 6–20)
CALCIUM: 8.4 mg/dL — AB (ref 8.9–10.3)
CO2: 22 mmol/L (ref 22–32)
CREATININE: 0.74 mg/dL (ref 0.61–1.24)
Chloride: 112 mmol/L — ABNORMAL HIGH (ref 101–111)
Glucose, Bld: 93 mg/dL (ref 65–99)
Potassium: 3.5 mmol/L (ref 3.5–5.1)
SODIUM: 141 mmol/L (ref 135–145)

## 2015-09-02 LAB — CBC
HCT: 35.7 % — ABNORMAL LOW (ref 39.0–52.0)
Hemoglobin: 11.7 g/dL — ABNORMAL LOW (ref 13.0–17.0)
MCH: 30.2 pg (ref 26.0–34.0)
MCHC: 32.8 g/dL (ref 30.0–36.0)
MCV: 92 fL (ref 78.0–100.0)
Platelets: 109 10*3/uL — ABNORMAL LOW (ref 150–400)
RBC: 3.88 MIL/uL — ABNORMAL LOW (ref 4.22–5.81)
RDW: 14.3 % (ref 11.5–15.5)
WBC: 9.3 10*3/uL (ref 4.0–10.5)

## 2015-09-02 LAB — HCV COMMENT:

## 2015-09-02 LAB — HEPATITIS C ANTIBODY (REFLEX): HCV Ab: 0.1 s/co ratio (ref 0.0–0.9)

## 2015-09-02 LAB — MAGNESIUM: MAGNESIUM: 2.2 mg/dL (ref 1.7–2.4)

## 2015-09-02 LAB — LEGIONELLA ANTIGEN, URINE

## 2015-09-02 MED ORDER — AZITHROMYCIN 500 MG PO TABS
250.0000 mg | ORAL_TABLET | Freq: Every day | ORAL | Status: DC
  Filled 2015-09-02: qty 1

## 2015-09-02 MED ORDER — OSELTAMIVIR PHOSPHATE 75 MG PO CAPS: 75.0000 mg | ORAL_CAPSULE | Freq: Two times a day (BID) | ORAL | Status: DC

## 2015-09-02 NOTE — Discharge Summary (Signed)
Name: Alan Rhodes MRN: [id number] DOB: December 26, 1940 75 y.o. PCP: Shelbie Ammons, MD  Date of Admission: 08/31/2015 10:17 AM Date of Discharge: 09/02/2015 Attending Physician: Tyson Alias, MD  Discharge Diagnosis: Principal Problem:   Community acquired pneumonia Active Problems:   MR (mental retardation), severe   Seizure disorder (HCC)   Autism   Rheumatoid arthritis (HCC)   Bipolar disorder (HCC)   Urinary incontinence   Hyperlipidemia   Influenza B  Discharge Medications:   Medication List    TAKE these medications        CALCARB 600/D 600-400 MG-UNIT tablet  Generic drug:  Calcium Carbonate-Vitamin D  Take 1 tablet by mouth daily.     divalproex 125 MG DR tablet  Commonly known as:  DEPAKOTE  Take 250-375 mg by mouth 2 (two) times daily. Take 2 tablets (250 mg) in the am & Take 3 tablets (375 mg) in the pm.     folic acid 1 MG tablet  Commonly known as:  FOLVITE  Take 1 mg by mouth daily.     furosemide 20 MG tablet  Commonly known as:  LASIX  Take 20 mg by mouth daily.     guaiFENesin 600 MG 12 hr tablet  Commonly known as:  MUCINEX  Take 600 mg by mouth 2 (two) times daily as needed for cough or to loosen phlegm.     oseltamivir 75 MG capsule  Commonly known as:  TAMIFLU  Take 1 capsule (75 mg total) by mouth 2 (two) times daily.     Oxcarbazepine 300 MG tablet  Commonly known as:  TRILEPTAL  Take 300 mg by mouth 2 (two) times daily.     oxybutynin 5 MG tablet  Commonly known as:  DITROPAN  Take 10 mg by mouth 3 (three) times daily. At 8 am, 2 pm & 8 pm.     pantoprazole 40 MG tablet  Commonly known as:  PROTONIX  Take 40 mg by mouth daily.     pravastatin 20 MG tablet  Commonly known as:  PRAVACHOL  Take 20 mg by mouth daily. 5 PM     predniSONE 5 MG tablet  Commonly known as:  DELTASONE  Take 5 mg by mouth daily with breakfast.     risperiDONE 0.5 MG tablet  Commonly known as:  RISPERDAL  Take 0.5 mg by mouth daily. At 4  pm.     risperiDONE 1 MG tablet  Commonly known as:  RISPERDAL  Take 1 mg by mouth 2 (two) times daily. At 8 am & 8 pm.     tamsulosin 0.4 MG Caps capsule  Commonly known as:  FLOMAX  Take 0.4 mg by mouth at bedtime.      ASK your doctor about these medications        MIRALAX powder  Generic drug:  polyethylene glycol powder  Take 17 g by mouth daily.        Disposition and follow-up:   Alan Rhodes was discharged from [facility] in Good condition.  At the hospital follow up visit please address:  1.  Influenza pneumonia: Admitted with significant cough, hypotension, and dehydration from Influenza B infection with possibly superimposed community acquired pneumonia. Discharged in good condition near his baseline on tamiflu. He should be seen for follow up and possibly CXR to check for resolution of his LLL and RML infiltrates in 2-3 weeks.  2.  Labs / imaging needed at time of follow-up: Chest  x-ray   Follow-up Appointments:     Follow-up Information    Follow up with HAGUE, Myrene Galas, MD. Schedule an appointment as soon as possible for a visit in 1 week.   Specialty:  Internal Medicine   Why:  For hospital follow up   Contact information:   [address] [place] [address] [phone]       Discharge Instructions:   Consultations:    Procedures Performed:  Dg Chest 2 View  08/31/2015  CLINICAL DATA:  75 year old male with a history of cough and weakness EXAM: CHEST - 2 VIEW COMPARISON:  08/31/2015 CT 03/04/2015 FINDINGS: Cardiomediastinal silhouette unchanged. Low lung volumes. Interval development of left greater than right airspace disease and interstitial opacities. No pneumothorax. Nodule identified at the left base on chest CT 03/04/2015, not well visualized on the current study. Osteopenia. Configuration of vertebral bodies is unchanged from comparison CT. Unremarkable appearance of the upper abdomen. IMPRESSION: Interval  development of multifocal pneumonia, worst on the left. Followup PA and lateral chest X-ray is recommended in 3-4 weeks following trial of antibiotic therapy to ensure resolution and exclude underlying malignancy. Signed, Yvone Neu. Loreta Ave, DO [facility] Electronically Signed   By: Gilmer Mor D.O.   On: 08/31/2015 13:43   Ct Head Wo Contrast  08/31/2015  CLINICAL DATA:  Lethargy/altered mental status.  History of seizures EXAM: CT HEAD WITHOUT CONTRAST TECHNIQUE: Contiguous axial images were obtained from the base of the skull through the vertex without intravenous contrast. COMPARISON:  March 04, 2015 FINDINGS: Mild diffuse atrophy is stable. There is no intracranial mass, hemorrhage, extra-axial fluid collection, or midline shift. There is patchy small vessel disease in the centra semiovale bilaterally, stable. No acute infarct identified. The bony calvarium appears intact. The mastoid air cells are clear. No intraorbital lesions are identified. There is mucosal thickening in the maxillary antra bilaterally as well as in several ethmoid air cells bilaterally. IMPRESSION: Mild atrophy with stable patchy periventricular small vessel disease. No acute infarct evident. No hemorrhage or mass effect. Areas of paranasal sinus disease noted. Electronically Signed   By: Bretta Bang III M.D.   On: 08/31/2015 11:16   Dg Chest Port 1 View  08/31/2015  CLINICAL DATA:  Altered mental status.  History of seizure. EXAM: PORTABLE CHEST 1 VIEW COMPARISON:  Single view of the chest 05/23/2015 and CT chest 03/04/2015. PA and lateral chest 03/03/2015. FINDINGS: The patient is rotated on the study. Lung volumes are low with basilar atelectasis. No consolidative process, pneumothorax or effusion is identified. Heart size is normal. IMPRESSION: No acute disease. Electronically Signed   By: Drusilla Kanner M.D.   On: 08/31/2015 11:13    Admission HPI: 75 y/o  man with PMHx of MR, autism, bipolar disorder, rheumatoid arthritis, urinary incontinence, and seizures presents to ED via EMS after his home health nursing aide noticed him to be too weak to participate in transfer to the car, febrile, and coughing. He has been coughing for 4 days prior to presentation, wet sounding but without much sputum poduction. He is nonverbal which is usual. She states that his not following directions and weakness are consistent with previous hospitalizations for illness. EMS staff reported a fever of 100.74F.  Of note patient nonverbal and history obtained from chart review and home health aide present at bedside.  In particular he was hospitalized in 12/2012 and 02/2015 for pneumonia. He was evaluated by Dr. 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 Caregiver denies history of obvious choking or coughing worsened with eating. He is taking prednisone 5mg  chronically for rheumatoid arthritis.  After arrival to the Emergency Department he was noted to be hypotensive which transiently improved after NS bolus. CT head unremarkable for new acute process. CXR obtained showing multifocal pneumonia, he was started on azithromycin and ceftriaxone and admitted to internal medicine.  Hospital Course by problem list: Multifocal community acquired pneumonia: 2/2 Influenza B with possible bacterial superinfection. He was significantly hypotensive on presentation to 80s/40s, somnolent, not interactive, and with coarse breath sounds. Blood pressure improved with fluid resuscitation and he was started on azithromycin and ceftriaxone for CAP coverage. Overnight influenza B PCR returned positive and tamiflu started. Antibiotics were continued empirically due to his severity of systemic symptoms at presentation. He showed a large clinical improvement on day 1 sitting up in bed in no distress,  and by hospital day 2 was behaving at his baseline per home care aide. Antibiotics were ended at day 3 and he was discharged to home to complete 2 additional days of tamiflu.  Discharge Vitals:   BP 150/69 mmHg  Pulse 63  Temp(Src) 97.5 F (36.4 C) (Oral)  Resp 18  Ht 5\' 2"  (1.575 m)  Wt 72.2 kg (159 lb 2.8 oz)  BMI 29.11 kg/m2  SpO2 99%  Discharge Labs:  Results for orders placed or performed during the hospital encounter of 08/31/15 (from the past 24 hour(s))  CBC     Status: Abnormal   Collection Time: 09/02/15  6:07 AM  Result Value Ref Range   WBC 9.3 4.0 - 10.5 K/uL   RBC 3.88 (L) 4.22 - 5.81 MIL/uL   Hemoglobin 11.7 (L) 13.0 - 17.0 g/dL   HCT 09/02/15 (L) 09/04/15 - 93.8 %   MCV 92.0 78.0 - 100.0 fL   MCH 30.2 26.0 - 34.0 pg   MCHC 32.8 30.0 - 36.0 g/dL   RDW 18.2 99.3 - 71.6 %   Platelets 109 (L) 150 - 400 K/uL  Basic metabolic panel     Status: Abnormal   Collection Time: 09/02/15  6:07 AM  Result Value Ref Range   Sodium 141 135 - 145 mmol/L   Potassium 3.5 3.5 - 5.1 mmol/L   Chloride 112 (H) 101 - 111 mmol/L   CO2 22 22 - 32 mmol/L   Glucose, Bld 93 65 - 99 mg/dL   BUN 8 6 - 20 mg/dL   Creatinine, Ser 89.3 0.61 - 1.24 mg/dL   Calcium 8.4 (L) 8.9 - 10.3 mg/dL   GFR calc non Af Amer >60 >60 mL/min   GFR calc Af Amer >60 >60 mL/min   Anion gap 7 5 - 15  Magnesium     Status: None   Collection Time: 09/02/15  6:07 AM  Result Value Ref Range   Magnesium 2.2 1.7 - 2.4 mg/dL    Signed: 8.10, MD 09/04/2015, 6:43 PM

## 2015-09-02 NOTE — Progress Notes (Signed)
  Subjective: [name] is still feeling well today and is sitting up in his bed trying to walk around. He appears to be at his baseline level of activity based on information from his caretaker.  Objective: BP 113/57 mmHg  Pulse 61  Temp(Src) 98.6 F (37 C) (Oral)  Resp 20  Ht 5\' 2"  (1.575 m)  Wt 72.2 kg (159 lb 2.8 oz)  BMI 29.11 kg/m2  SpO2 95% General appearance: alert and no distress Lungs: clear to auscultation bilaterally Heart: regular rate and rhythm Extremities: No swelling or edema  Lab Results: Hemoglobin is down to 11.7 from 12.7 yesterday HCV negative Magnesium 2.2 back up from 1.6 yesterday WBC 9.3  Assessment/Plan: Flu: He is much improved from his presentation and there is no evidence of a bacterial superinfection. We will finish his 3 days of ceftriaxone and azithromycin but then discontinue antibiotics. He will finish 5 days of tamiflu -Ceftriaxone 1g IV daily 3/3 -Azithromycin 250 mg IV daily 3/3 -Tamiflu 75 mg oral BID 3/5  RA: Now that he is feeling better and has no other infections, [name] is okay to go back down to his normal daily dose of prednisone. -Prednisone 5 mg oral daily  This is a Orson Slick Note.  The care of the patient was discussed with Dr. Psychologist, occupational and the assessment and plan formulated with their assistance.  Please see their attached note for official documentation of the daily encounter.   LOS: 2 days   Dimple Casey, Med Student 09/02/2015, 11:12 AM

## 2015-09-02 NOTE — Care Management Important Message (Signed)
Important Message  Patient Details  Name: Alan Rhodes MRN: [id number] Date of Birth: September 15, 1940   Medicare Important Message Given:  Yes    Lawerance Sabal, RN 09/02/2015, 9:19 AMImportant Message  Patient Details  Name: Alan Rhodes MRN: [id number] Date of Birth: 1941-04-01   Medicare Important Message Given:  Yes    Lawerance Sabal, RN 09/02/2015, 9:19 AM

## 2015-09-02 NOTE — Discharge Instructions (Signed)
You will need to continue taking osteltamivir (Tamiflu) twice daily for 2 more days to resolve this influenza infection as quickly as possible.  You are unlikely to be contagious at this stage of the infection.

## 2015-09-02 NOTE — Progress Notes (Signed)
Speech Language Pathology Dysphagia Treatment Patient Details Name: Alan Rhodes MRN: [id number] DOB: 05/05/1941 Today's Date: 09/02/2015 Time: 1142-1150 SLP Time Calculation (min) (ACUTE ONLY): 8 min  Assessment / Plan / Recommendation Clinical Impression    Pt exhibited immediate coughing x1 with intake of solids, however upon further inspection pt is edentulous; may have had particulate spill prematurely due to poor bolus cohesion. No overt s/s of penetration/aspiration observed with thin liquids. Pt educated re: diet recommendation of Dysphagia 2 (fine chop) textures and thin liquids (straws allowed), meds whole in puree. No further SLP intervention warranted.    Diet Recommendation    Dysphagia 2 (fine chop) diet, thin liquids (straws allowed), meds whole in puree   SLP Plan All goals met      Swallowing Goals     General Behavior/Cognition: Alert;Cooperative;Confused;Requires cueing Patient Positioning: Upright in bed Oral care provided: N/A HPI: 75 y.o. male with h/o profound MR, autism, bipolar disorder, rheumatoid arthritis, urinary incontinence and PTSD, who presented to ED due to weakness, fever and non-productive wet sounding cough for 4 days PTA observed by home health aide. CT Head 3/15 no acute infarct evident, no hemorrhage or mass effect. CXR 3/15 intervel development of multifocal pneumonia, worst on L.   Oral Cavity - Oral Hygiene     Dysphagia Treatment Treatment Methods: Skilled observation;Patient/caregiver education Patient observed directly with PO's: Yes Type of PO's observed: Regular;Thin liquids Feeding: Needs assist;Needs set up;Able to feed self Liquids provided via: Cup;Straw Oral Phase Signs & Symptoms:  (none) Pharyngeal Phase Signs & Symptoms: Immediate cough Type of cueing: Verbal Amount of cueing: Moderate          09/02/2015, 12:24 PM    , Student-SLP     

## 2015-09-02 NOTE — Progress Notes (Signed)
   Subjective: Patient is comfortable appearing, afebrile, no longer coughing. He speaks unintelligibly when prompted.  Objective: Vital signs in last 24 hours: Filed Vitals:   09/01/15 1417 09/01/15 2241 09/02/15 0500 09/02/15 0528  BP: 110/58 127/63  113/57  Pulse: 71 56  61  Temp: 98.6 F (37 C) 98.3 F (36.8 C)  98.6 F (37 C)  TempSrc: Oral Axillary  Oral  Resp: 14 16  20   Height:      Weight:   72.2 kg (159 lb 2.8 oz)   SpO2: 97% 99%  95%   Weight change: 5.521 kg (12 lb 2.8 oz)  Intake/Output Summary (Last 24 hours) at 09/02/15 0859 Last data filed at 09/02/15 0555  Gross per 24 hour  Intake    355 ml  Output    400 ml  Net    -45 ml   GENERAL- awake spontaneously, vocalizing, NAD CARDIAC- RRR, no murmurs, rubs or gallops. RESP- CTAB without crackles or wheezes NEURO- Unable to assess 2/2 patient mental status EXTREMITIES- symmetric, trace pedal edema SKIN- Warm, dry, No rash or lesion. PSYCH- noncooperative, awake spontaneously  Medications: I have reviewed the patient's current medications. Scheduled Meds: . acetaminophen  650 mg Oral Once  . antiseptic oral rinse  7 mL Mouth Rinse BID  . azithromycin  250 mg Intravenous Q24H  . benzonatate  100 mg Oral BID  . cefTRIAXone (ROCEPHIN)  IV  1 g Intravenous Q24H  . Chlorhexidine Gluconate Cloth  6 each Topical Q0600  . divalproex  250 mg Oral Daily  . divalproex  375 mg Oral QHS  . enoxaparin (LOVENOX) injection  40 mg Subcutaneous Q24H  . folic acid  1 mg Oral Daily  . mupirocin ointment  1 application Nasal BID  . oseltamivir  75 mg Oral BID  . Oxcarbazepine  300 mg Oral BID  . oxybutynin  10 mg Oral TID  . pantoprazole  40 mg Oral Daily  . pravastatin  20 mg Oral q1800  . predniSONE  5 mg Oral Q breakfast  . risperiDONE  0.5 mg Oral Q24H  . risperiDONE  1 mg Oral BID  . sodium chloride flush  3 mL Intravenous Q12H  . tamsulosin  0.4 mg Oral QHS   Continuous Infusions:  PRN Meds:.acetaminophen  **OR** acetaminophen, ondansetron **OR** ondansetron (ZOFRAN) IV, senna-docusate Assessment/Plan: Community acquired pneumonia 2/2 influenza: Clinically much improved now, seems to be at his baseline according to his health aide at bedside yesterday afternoon. Will finish a abtx today and discharge on tamiflu to finish course by 3/19. -Azithromycin 250mg  3/15>>> -Ceftriaxone 1g 3/15>>>3/17  Chronic lower extremity edema: Blood pressures improved, will resume home lasix at discharge today or inpatient if discharge is delayed.  Diet: Dysphagia 3 VTE ppx: Fifty-Six lovenox FULL CODE  Dispo: Patient has made great clinical improvement over the past 2 days and is likely to be appropriate for discharge back to group home by this afternoon   LOS: 2 days   4/19, MD 09/02/2015, 8:59 AM

## 2015-09-02 NOTE — Progress Notes (Signed)
Patient lives at [address]. They will come and pick him up today [phone])  CSW signing off.  Alan Rhodes Alan Rhodes LCSWA [phone]

## 2015-09-02 NOTE — Clinical Documentation Improvement (Signed)
Internal Medicine  Can the diagnosis of altered mental status be further specified in progress notes and discharge summary?   Metabolic encephalopathy  Other  Clinically Undetermined  Document any associated diagnoses/conditions.   Supporting Information: 75 year old male w/ history of autism, bipolar disease, and profound mental retardation.  ED Provider note: There was also report of him being lethargic and unable to follow commands and being nonverbal which is not his baseline. Concern patient's altered mental status is related to infectious etiology such as pneumonia versus UTI.   3/16 progress note Patient is much more alert this morning sitting up in bed and awake. He still does not follow commands well. Rapid improvement overnight. Patient started on tamiflu due to unknown exact duration of preceding illness and severity of his presentation with hypotension down to 80s/40s, weakness, and altered mental status before improving with IVF boluses.   Treatments IV fluid boluses Tamiflu Azithromycin Ceftriaxone  Please exercise your independent, professional judgment when responding. A specific answer is not anticipated or expected.   Thank You,  Harless Litten Health Information Management [place] [phone]

## 2015-10-14 ENCOUNTER — Encounter (HOSPITAL_COMMUNITY): Payer: Self-pay

## 2015-10-14 ENCOUNTER — Ambulatory Visit (HOSPITAL_COMMUNITY)
Admission: EM | Admit: 2015-10-14 | Discharge: 2015-10-14 | Disposition: A | Discharge status: Home / Self Care | Payer: Medicare Other | Attending: Family Medicine | Admitting: Family Medicine

## 2015-10-14 DIAGNOSIS — B370 Candidal stomatitis: Secondary | ICD-10-CM | POA: Diagnosis not present

## 2015-10-14 MED ORDER — NYSTATIN 100000 UNIT/ML MT SUSP: 500000.0000 [IU] | Freq: Four times a day (QID) | OROMUCOSAL | Status: DC

## 2015-10-14 NOTE — ED Provider Notes (Addendum)
CSN: [id number]     Arrival date & time 10/14/15  1822 History   First MD Initiated Contact with Patient 10/14/15 1954     Chief Complaint  Patient presents with  . Thrush   (Consider location/radiation/quality/duration/timing/severity/associated sxs/prior Treatment) The history is provided by a caregiver. No language interpreter was used.   Patient presents with white plaques in mouth, some mildly decreased oral intake in the past 2 days.  No fevers or chills. Caregiver noticed the white plaques and that patient was pursing his lips.  He is nonverbal for the most part.   Was hospitalized for CAP in mid-March, completed abx therapy and has been well since. No cough, no N/V/D, no fevers/ chills.  No other illness.  Past Medical History  Diagnosis Date  . Autism   . PTSD (post-traumatic stress disorder)   . Bipolar disorder, unspecified (HCC)   . Profound mental retardation   . Seizure disorder (HCC)   . Rheumatoid arteritis   . Constipation   . Incontinence of bowel   . Bladder incontinence    Past Surgical History  Procedure Laterality Date  . No past surgeries     Family History  Problem Relation Age of Onset  . Family history unknown: Yes   Social History  Substance Use Topics  . Smoking status: Never Smoker   . Smokeless tobacco: Never Used  . Alcohol Use: No    Review of Systems  Allergies  Review of patient's allergies indicates no known allergies.  Home Medications   Prior to Admission medications   Medication Sig Start Date End Date Taking? Authorizing Provider  Calcium Carbonate-Vitamin D (CALCARB 600/D) 600-400 MG-UNIT per tablet Take 1 tablet by mouth daily.    Yes Historical Provider, MD  divalproex (DEPAKOTE) 125 MG DR tablet Take 250-375 mg by mouth 2 (two) times daily. Take 2 tablets (250 mg) in the am & Take 3 tablets (375 mg) in the pm.   Yes Historical Provider, MD  folic acid (FOLVITE) 1 MG tablet Take 1 mg by mouth daily.   Yes Historical  Provider, MD  furosemide (LASIX) 20 MG tablet Take 20 mg by mouth daily.   Yes Historical Provider, MD  Oxcarbazepine (TRILEPTAL) 300 MG tablet Take 300 mg by mouth 2 (two) times daily.   Yes Historical Provider, MD  oxybutynin (DITROPAN) 5 MG tablet Take 10 mg by mouth 3 (three) times daily. At 8 am, 2 pm & 8 pm.   Yes Historical Provider, MD  pantoprazole (PROTONIX) 40 MG tablet Take 40 mg by mouth daily.   Yes Historical Provider, MD  polyethylene glycol powder (MIRALAX) powder Take 17 g by mouth daily.   Yes Historical Provider, MD  pravastatin (PRAVACHOL) 20 MG tablet Take 20 mg by mouth daily. 5 PM   Yes Historical Provider, MD  predniSONE (DELTASONE) 5 MG tablet Take 5 mg by mouth daily with breakfast.   Yes Historical Provider, MD  risperiDONE (RISPERDAL) 0.5 MG tablet Take 0.5 mg by mouth daily. At 4 pm.   Yes Historical Provider, MD  risperiDONE (RISPERDAL) 1 MG tablet Take 1 mg by mouth 2 (two) times daily. At 8 am & 8 pm.   Yes Historical Provider, MD  tamsulosin (FLOMAX) 0.4 MG CAPS capsule Take 0.4 mg by mouth at bedtime.   Yes Historical Provider, MD  guaiFENesin (MUCINEX) 600 MG 12 hr tablet Take 600 mg by mouth 2 (two) times daily as needed for cough or to loosen phlegm.  Historical Provider, MD  nystatin (MYCOSTATIN) 100000 UNIT/ML suspension Take 5 mLs (500,000 Units total) by mouth 4 (four) times daily. 10/14/15   Barbaraann Barthel, MD  oseltamivir (TAMIFLU) 75 MG capsule Take 1 capsule (75 mg total) by mouth 2 (two) times daily. 09/02/15   Fuller Plan, MD   Meds Ordered and Administered this Visit  Medications - No data to display  BP 167/78 mmHg  Pulse 68  Temp(Src) 98.4 F (36.9 C) (Oral)  Resp 16  SpO2 98% No data found.   Physical Exam  Constitutional: He appears well-developed and well-nourished. No distress.  HENT:  Mild erythema of buccal mucosa with white plaques along inside of lips.    No dentures.   Moist mucus membranes.   Exam limited by  patient lack of cooperation with instructions.   Neck: Normal range of motion. Neck supple.  Lymphadenopathy:    He has no cervical adenopathy.  Skin: He is not diaphoretic.    ED Course  Procedures (including critical care time)  Labs Review Labs Reviewed - No data to display  Imaging Review No results found.   Visual Acuity Review  Right Eye Distance:   Left Eye Distance:   Bilateral Distance:    Right Eye Near:   Left Eye Near:    Bilateral Near:         MDM   1. Oropharyngeal candidiasis    Oropharyngeal candidiasis by exam.  NYSTATIN swish and swallow four times daily for 7 to 14 days, for follow up with his primary doctor at [facility] in [place] in 1-2 weeks.   Written instructions (and institution's form hand completed) given to caregiver.     Barbaraann Barthel, MD 10/14/15 2015  Barbaraann Barthel, MD 10/14/15 2015

## 2015-10-14 NOTE — Discharge Instructions (Signed)
It is a pleasure to see Alan Rhodes today.  I believe he has thrush (candidal infection in the mouth).   I am prescribing his NYSTATIN SWISH AND SWALLOW.  Give Alan Rhodes 12mL (1 teaspoon) by mouth to swish in mouth and swallow, 4 times daily.  Duration of treatment is for 7 to 14 days.   Follow up with his primary doctor at [facility] in [place] in 1 to 2 weeks, or return to urgent care or his primary doctor sooner as needed.

## 2015-10-14 NOTE — ED Notes (Signed)
Patient presents with white bumps in mouth x4 days his bottom lip has white bumps and tongue is coated white, patient caretaker thinks it may be thrush. No acute distress Care taker at bedside

## 2015-10-16 MED ORDER — NYSTATIN 100000 UNIT/ML MT SUSP: 500000.0000 [IU] | Freq: Four times a day (QID) | OROMUCOSAL | Status: DC

## 2015-11-27 ENCOUNTER — Ambulatory Visit (HOSPITAL_COMMUNITY)
Admission: EM | Admit: 2015-11-27 | Discharge: 2015-11-27 | Disposition: A | Discharge status: Home / Self Care | Payer: Medicare Other | Attending: Emergency Medicine | Admitting: Emergency Medicine

## 2015-11-27 ENCOUNTER — Ambulatory Visit (INDEPENDENT_AMBULATORY_CARE_PROVIDER_SITE_OTHER): Payer: Medicare Other

## 2015-11-27 ENCOUNTER — Encounter (HOSPITAL_COMMUNITY): Payer: Self-pay | Admitting: *Deleted

## 2015-11-27 DIAGNOSIS — R229 Localized swelling, mass and lump, unspecified: Secondary | ICD-10-CM | POA: Diagnosis not present

## 2015-11-27 DIAGNOSIS — R2689 Other abnormalities of gait and mobility: Secondary | ICD-10-CM | POA: Diagnosis not present

## 2015-11-27 NOTE — ED Notes (Signed)
Per caretaker: Noticed pt was limping when he returned from day program 2 days ago; noticed his left great toenail was discolored and toe was swollen.  No known injury.  No fevers.

## 2015-11-27 NOTE — ED Provider Notes (Signed)
CSN: [id number]     Arrival date & time 11/27/15  1626 History   First MD Initiated Contact with Patient 11/27/15 1651     Chief Complaint  Patient presents with  . Toe Pain   (Consider location/radiation/quality/duration/timing/severity/associated sxs/prior Treatment) Patient is a 75 y.o. male presenting with toe pain. The history is provided by a caregiver. No language interpreter was used.  Toe Pain This is a new problem. The current episode started 2 days ago. The problem occurs constantly. The problem has been gradually worsening. Pertinent negatives include no chest pain and no headaches. The symptoms are aggravated by walking. Nothing relieves the symptoms. He has tried nothing for the symptoms.    Past Medical History  Diagnosis Date  . Autism   . PTSD (post-traumatic stress disorder)   . Bipolar disorder, unspecified (HCC)   . Profound mental retardation   . Seizure disorder (HCC)   . Rheumatoid arteritis   . Constipation   . Incontinence of bowel   . Bladder incontinence    Past Surgical History  Procedure Laterality Date  . No past surgeries     Family History  Problem Relation Age of Onset  . Family history unknown: Yes   Social History  Substance Use Topics  . Smoking status: Never Smoker   . Smokeless tobacco: Never Used  . Alcohol Use: No    Review of Systems  Unable to perform ROS: Patient nonverbal  Cardiovascular: Negative for chest pain.  Neurological: Negative for headaches.    Allergies  Review of patient's allergies indicates no known allergies.  Home Medications   Prior to Admission medications   Medication Sig Start Date End Date Taking? Authorizing Provider  divalproex (DEPAKOTE) 125 MG DR tablet Take 250-375 mg by mouth 2 (two) times daily. Take 2 tablets (250 mg) in the am & Take 3 tablets (375 mg) in the pm.   Yes Historical Provider, MD  folic acid (FOLVITE) 1 MG tablet Take 1 mg by mouth daily.   Yes Historical Provider, MD    furosemide (LASIX) 20 MG tablet Take 20 mg by mouth daily.   Yes Historical Provider, MD  Oxcarbazepine (TRILEPTAL) 300 MG tablet Take 300 mg by mouth 2 (two) times daily.   Yes Historical Provider, MD  oxybutynin (DITROPAN) 5 MG tablet Take 10 mg by mouth 3 (three) times daily. At 8 am, 2 pm & 8 pm.   Yes Historical Provider, MD  pantoprazole (PROTONIX) 40 MG tablet Take 40 mg by mouth daily.   Yes Historical Provider, MD  pravastatin (PRAVACHOL) 20 MG tablet Take 20 mg by mouth daily. 5 PM   Yes Historical Provider, MD  predniSONE (DELTASONE) 5 MG tablet Take 5 mg by mouth daily with breakfast.   Yes Historical Provider, MD  risperiDONE (RISPERDAL) 0.5 MG tablet Take 0.5 mg by mouth daily. At 4 pm.   Yes Historical Provider, MD  tamsulosin (FLOMAX) 0.4 MG CAPS capsule Take 0.4 mg by mouth at bedtime.   Yes Historical Provider, MD  Calcium Carbonate-Vitamin D (CALCARB 600/D) 600-400 MG-UNIT per tablet Take 1 tablet by mouth daily.     Historical Provider, MD  guaiFENesin (MUCINEX) 600 MG 12 hr tablet Take 600 mg by mouth 2 (two) times daily as needed for cough or to loosen phlegm.    Historical Provider, MD  nystatin (MYCOSTATIN) 100000 UNIT/ML suspension Take 5 mLs (500,000 Units total) by mouth 4 (four) times daily. 10/16/15   Charm Rings, MD  oseltamivir (TAMIFLU)  75 MG capsule Take 1 capsule (75 mg total) by mouth 2 (two) times daily. 09/02/15   Fuller Plan, MD  polyethylene glycol powder (MIRALAX) powder Take 17 g by mouth daily.    Historical Provider, MD  risperiDONE (RISPERDAL) 1 MG tablet Take 1 mg by mouth 2 (two) times daily. At 8 am & 8 pm.    Historical Provider, MD   Meds Ordered and Administered this Visit  Medications - No data to display  BP 159/84 mmHg  Pulse 77  Temp(Src) 98.5 F (36.9 C) (Temporal)  Resp 16  SpO2 98% No data found.   Physical Exam  Constitutional: He appears well-developed and well-nourished.  Pulmonary/Chest: Effort normal.   Musculoskeletal: He exhibits no tenderness.       Feet:  Nursing note and vitals reviewed.   ED Course  Procedures (including critical care time)  Labs Review Labs Reviewed - No data to display  Imaging Review Dg Foot Complete Left  11/27/2015  CLINICAL DATA:  Left foot pain and swelling.  No known injury. EXAM: LEFT FOOT - COMPLETE 3+ VIEW COMPARISON:  None. FINDINGS: Diffuse dorsal soft tissue swelling, most pronounced distally. No fracture or dislocation seen. Pronounced dorsal tarsal hyperostosis. IMPRESSION: Soft tissue swelling without underlying fracture. Electronically Signed   By: Beckie Salts M.D.   On: 11/27/2015 17:28   Films and report reviewed in MDM process.   Visual Acuity Review  Right Eye Distance:   Left Eye Distance:   Bilateral Distance:    Right Eye Near:   Left Eye Near:    Bilateral Near:      No fracture per radiology. Suggest symptomatic tx.  Follow up with PCP   MDM   1. Soft tissue swelling   2. Limp    No indication for additional testing at this time.   Suggest treating STS symptomatically and follow up with PCP    Tharon Aquas, PA 11/27/15 1800

## 2015-11-27 NOTE — Discharge Instructions (Signed)
Cane Use  Canes are used to help with walking. Using a cane makes you more stable, reduces pain, and eases strain on certain muscle groups. It is important to use a cane that fits properly. A cane fits properly if the top of the cane comes to your wrist joint when you are standing upright with your arm relaxed at your side.  HOW TO USE YOUR CANE  Hold your cane in the hand opposite the injured or weaker side. Move the cane with the weaker foot at all times.  Walking  · Put as much weight on the cane as necessary to make walking comfortable, stable, and smooth.  · Stand tall with good posture and look ahead, not down at your feet.  Going Up Steps  · Step first with your stronger foot.  · Move the cane and the weaker foot up the step at the same time.  Going Down Steps  · Step first with the cane and your weaker foot.  · Always use the railing with your free hand.  SEEK MEDICAL CARE IF:  · You still feel unsteady on your feet.  · You develop new pain, for example in your back, shoulder, wrist, or hip.  · You develop any numbness or tingling.  SEEK IMMEDIATE MEDICAL CARE IF:  You fall.     This information is not intended to replace advice given to you by your health care provider. Make sure you discuss any questions you have with your health care provider.     Document Released: 06/04/2005 Document Revised: 06/09/2013 Document Reviewed: 02/05/2013  Elsevier Interactive Patient Education ©2016 [employer]

## 2015-12-23 ENCOUNTER — Ambulatory Visit: Payer: Medicare Other | Admitting: Podiatry

## 2016-01-16 ENCOUNTER — Ambulatory Visit (INDEPENDENT_AMBULATORY_CARE_PROVIDER_SITE_OTHER): Payer: Medicare Other | Admitting: Podiatry

## 2016-01-16 ENCOUNTER — Encounter: Payer: Self-pay | Admitting: Podiatry

## 2016-01-16 VITALS — BP 158/86 | HR 62 | Resp 18

## 2016-01-16 DIAGNOSIS — S90229A Contusion of unspecified lesser toe(s) with damage to nail, initial encounter: Secondary | ICD-10-CM | POA: Diagnosis not present

## 2016-01-17 NOTE — Progress Notes (Signed)
Subjective:     Patient ID: Alan Rhodes, male   DOB: 02-08-1941, 75 y.o.   MRN: [id number]  HPI 75 year old male presents the office they with a caregiver for concerns of his left big toenail right big toenail discoloration. Months ago he did have swelling to his left foot and he went to the doctor had an x-ray taken which not reveal any fracture. The caregiver cannot recall any injury that he had. Initially in May she does that he was limping somewhat however he has not been doing that recently and he has not been complaining of any pain although he is nonverbal. She states that he has not been lipping or holding his feet or noticed any swelling or redness to his feet. Denies any redness or drainage along the toenail sites. No other complaints.  Review of Systems  All other systems reviewed and are negative.      Objective:   Physical Exam General: AAO x3, NAD  Dermatological: Left hallux toenail is somewhat loose midline nailbed and there is what appears to be a subungual hematoma. Upon Coumadin the toenail the dried blood was removed and is on. There does not appear to be any extension of the hyperpigmentation into the skin. To lesser degree in the right hallux on the lateral portion of the nails are small and supple hematomas well. There is no apparent tenderness to the toenail and there is no drainage or pus.  Vascular: Dorsalis Pedis artery and Posterior Tibial artery pedal pulses are 2/4 bilateral with immedate capillary fill time.  There is no pain with calf compression, swelling, warmth, erythema.   Neruologic: Achilles reflexes intact.  Musculoskeletal: I'm unable to elicit any area of tenderness bilateral feet. Range of motion intact.  Assessment: Bilateral hallux subungual hematoma  Plan: -Treatment options discussed including all alternatives, risks, and complications -Etiology of symptoms were discussed -Bilateral hallux toenails are debrided without complications or  bleeding. 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 I discussed with the caregiver total avulsion versus living the toenail grow out. The left toe nail may fall off. At this time is no signs of infection and no pain or pain. We'll do toenail grow out and monitor. If it starts to loosen the call the office if he can take it off or if there is any signs or symptoms of infection  Ovid Curd, DPM

## 2016-02-16 ENCOUNTER — Ambulatory Visit (HOSPITAL_COMMUNITY)
Admission: EM | Admit: 2016-02-16 | Discharge: 2016-02-16 | Disposition: A | Discharge status: Home / Self Care | Payer: Medicare Other

## 2016-02-16 ENCOUNTER — Encounter (HOSPITAL_COMMUNITY): Payer: Self-pay | Admitting: Emergency Medicine

## 2016-02-16 DIAGNOSIS — S99921A Unspecified injury of right foot, initial encounter: Secondary | ICD-10-CM

## 2016-02-16 NOTE — Discharge Instructions (Signed)
Apply neosporin ointment to right first toenail every day for next week.  Apply a bandaid

## 2016-02-16 NOTE — ED Triage Notes (Signed)
Pt was walking up a set of dark stairs when he tripped.  He had a previous injury to his left great toe and the nail came off today.  He has an injury to the right great toe today with some bleeding.

## 2016-02-16 NOTE — ED Provider Notes (Signed)
CSN: [id number]     Arrival date & time 02/16/16  1227 History   None    Chief Complaint  Patient presents with  . Toe Injury    right great  . Nail Problem    left great toe   (Consider location/radiation/quality/duration/timing/severity/associated sxs/prior Treatment) Patient has toenail injury to left first toe and has toenail injury and is bleeding.  Patient is on anticoagulants.   The history is provided by the nursing home.  Toe Pain  This is a new problem. The current episode started 12 to 24 hours ago. The problem occurs constantly. The problem has not changed since onset.Nothing aggravates the symptoms. Nothing relieves the symptoms. He has tried nothing for the symptoms.    Past Medical History:  Diagnosis Date  . Autism   . Bipolar disorder, unspecified (HCC)   . Bladder incontinence   . Constipation   . Incontinence of bowel   . Profound mental retardation   . PTSD (post-traumatic stress disorder)   . Rheumatoid arteritis   . Seizure disorder [facility])    Past Surgical History:  Procedure Laterality Date  . NO PAST SURGERIES     Family History  Problem Relation Age of Onset  . Family history unknown: Yes   Social History  Substance Use Topics  . Smoking status: Never Smoker  . Smokeless tobacco: Never Used  . Alcohol use No    Review of Systems  HENT: Negative.   Eyes: Negative.   Respiratory: Negative.   Cardiovascular: Negative.   Genitourinary: Negative.   Musculoskeletal: Positive for myalgias.  Skin: Negative.   Neurological: Negative.   Psychiatric/Behavioral: Negative.     Allergies  Review of patient's allergies indicates no known allergies.  Home Medications   Prior to Admission medications   Medication Sig Start Date End Date Taking? Authorizing Provider  ciclopirox (PENLAC) 8 % solution Apply topically at bedtime. Apply over nail and surrounding skin. Apply daily over previous coat. After seven (7) days, may remove with alcohol and  continue cycle.   Yes Historical Provider, MD  divalproex (DEPAKOTE) 125 MG DR tablet Take 250-375 mg by mouth 2 (two) times daily. Take 2 tablets (250 mg) in the am & Take 3 tablets (375 mg) in the pm.   Yes Historical Provider, MD  folic acid (FOLVITE) 1 MG tablet Take 1 mg by mouth daily.   Yes Historical Provider, MD  furosemide (LASIX) 20 MG tablet Take 20 mg by mouth daily.   Yes Historical Provider, MD  Oxcarbazepine (TRILEPTAL) 300 MG tablet Take 300 mg by mouth 2 (two) times daily.   Yes Historical Provider, MD  oxybutynin (DITROPAN) 5 MG tablet Take 10 mg by mouth 3 (three) times daily. At 8 am, 2 pm & 8 pm.   Yes Historical Provider, MD  pantoprazole (PROTONIX) 40 MG tablet Take 40 mg by mouth daily.   Yes Historical Provider, MD  polyethylene glycol powder (MIRALAX) powder Take 17 g by mouth daily.   Yes Historical Provider, MD  pravastatin (PRAVACHOL) 20 MG tablet Take 20 mg by mouth daily. 5 PM   Yes Historical Provider, MD  predniSONE (DELTASONE) 5 MG tablet Take 5 mg by mouth daily with breakfast.   Yes Historical Provider, MD  risperiDONE (RISPERDAL) 0.5 MG tablet Take 0.5 mg by mouth daily. At 4 pm.   Yes Historical Provider, MD  risperiDONE (RISPERDAL) 1 MG tablet Take 1 mg by mouth 2 (two) times daily. At 8 am & 8 pm.  Yes Historical Provider, MD  rivaroxaban (XARELTO) 20 MG TABS tablet Take 20 mg by mouth daily with supper.   Yes Historical Provider, MD  tamsulosin (FLOMAX) 0.4 MG CAPS capsule Take 0.4 mg by mouth at bedtime.   Yes Historical Provider, MD  terbinafine (LAMISIL) 250 MG tablet Take 250 mg by mouth daily.   Yes Historical Provider, MD  Calcium Carbonate-Vitamin D (CALCARB 600/D) 600-400 MG-UNIT per tablet Take 1 tablet by mouth daily.     Historical Provider, MD  cephALEXin (KEFLEX) 500 MG capsule Take 1 capsule (500 mg total) by mouth 3 (three) times daily. 02/27/16   Vivi Barrack, DPM  guaiFENesin (MUCINEX) 600 MG 12 hr tablet Take 600 mg by mouth 2 (two)  times daily as needed for cough or to loosen phlegm.    Historical Provider, MD  nystatin (MYCOSTATIN) 100000 UNIT/ML suspension Take 5 mLs (500,000 Units total) by mouth 4 (four) times daily. 10/16/15   Charm Rings, MD  oseltamivir (TAMIFLU) 75 MG capsule Take 1 capsule (75 mg total) by mouth 2 (two) times daily. 09/02/15   Fuller Plan, MD   Meds Ordered and Administered this Visit  Medications - No data to display  BP 159/70 (BP Location: Left Arm)   Pulse 81   Temp 97.4 F (36.3 C) (Oral)   SpO2 98%  No data found.   Physical Exam  Constitutional: He appears well-developed and well-nourished.  HENT:  Head: Normocephalic and atraumatic.  Cardiovascular: Normal rate and regular rhythm.   Pulmonary/Chest: Effort normal and breath sounds normal.  Abdominal: Soft. Bowel sounds are normal.  Musculoskeletal: He exhibits tenderness.  Left toe tender with palpation, toenail is bleeding.  Nursing note and vitals reviewed.   Urgent Care Course   Clinical Course    Procedures (including critical care time)  Labs Review Labs Reviewed - No data to display  Imaging Review No results found.   Visual Acuity Review  Right Eye Distance:   Left Eye Distance:   Bilateral Distance:    Right Eye Near:   Left Eye Near:    Bilateral Near:         MDM  Left first toe contusion Left first toe nail injury.  Advised to keep dressing on toe for a day and then change qd.     Deatra Canter, FNP 03/02/16 1313    Eustace Moore, MD 04/08/16 [id number]

## 2016-02-27 ENCOUNTER — Ambulatory Visit (INDEPENDENT_AMBULATORY_CARE_PROVIDER_SITE_OTHER): Payer: Medicare Other | Admitting: Podiatry

## 2016-02-27 ENCOUNTER — Ambulatory Visit (INDEPENDENT_AMBULATORY_CARE_PROVIDER_SITE_OTHER): Payer: Medicare Other

## 2016-02-27 ENCOUNTER — Encounter: Payer: Self-pay | Admitting: Podiatry

## 2016-02-27 DIAGNOSIS — S90221A Contusion of right lesser toe(s) with damage to nail, initial encounter: Secondary | ICD-10-CM

## 2016-02-27 DIAGNOSIS — L03031 Cellulitis of right toe: Secondary | ICD-10-CM | POA: Diagnosis not present

## 2016-02-27 DIAGNOSIS — S90229A Contusion of unspecified lesser toe(s) with damage to nail, initial encounter: Secondary | ICD-10-CM

## 2016-02-27 DIAGNOSIS — L02611 Cutaneous abscess of right foot: Secondary | ICD-10-CM | POA: Diagnosis not present

## 2016-02-27 MED ORDER — CEPHALEXIN 500 MG PO CAPS: 500.0000 mg | ORAL_CAPSULE | Freq: Three times a day (TID) | ORAL | 2 refills | Status: DC

## 2016-02-27 NOTE — Patient Instructions (Signed)

## 2016-02-27 NOTE — Progress Notes (Signed)
Subjective: 75 year old male persists the office they with his caregivers for concerns of right big toe injury. Last week he stubbed his right big toe and afterwards he developed some bloody the toenails Wells pain and there is been some redness around the nail denies any drainage or pus. Denies any systemic complaints such as fevers, chills, nausea, vomiting. No acute changes since last appointment, and no other complaints at this time.   Objective: NAD DP/PT pulses palpable bilaterally, CRT less than 3 seconds Mild chronic edema to bilateral lower extremities. There is no one erythema or increase in warmth. Along the right hallux there is some evidence a small amount of subungual hematoma and dried blood on the nail borders. There is mild surrounding erythema on the proximal nail border without any ascending synovitis P there is no drainage or pus. There does appear to be some tenderness of the toenail. The toenail is firmly adhered to the underlying nail bed. No other areas of tenderness. No edema, erythema, increase in warmth to bilateral lower extremities.  No open lesions or pre-ulcerative lesions.  No pain with calf compression, swelling, warmth, erythema  Assessment: Right hallux subungual hematoma, mild with toe contusion  Plan: -All treatment options discussed with the patient including all alternatives, risks, complications.  -X-rays were obtained and reviewed with the patient. Arthritic changes present of the MPJs her there is no evidence of acute fracture. -I started to debride his toenail but this was painful so I recommended total nail avulsion however he would not tolerate this   purse caregivers. We'll start with oral antibiotics I prescribed Keflex. Neosporin dressing changes daily. This was applied today. Also Epson salt soaks. I'll see him back in 2 weeks at that time if symptoms continue nail avulsion. Follow-up sooner if any issues are to arise. -Patient encouraged to call  the office with any questions, concerns, change in symptoms.   Ovid Curd, DPM

## 2016-03-12 ENCOUNTER — Ambulatory Visit (INDEPENDENT_AMBULATORY_CARE_PROVIDER_SITE_OTHER): Payer: Medicare Other | Admitting: Podiatry

## 2016-03-12 ENCOUNTER — Encounter: Payer: Self-pay | Admitting: Podiatry

## 2016-03-12 DIAGNOSIS — S90221A Contusion of right lesser toe(s) with damage to nail, initial encounter: Secondary | ICD-10-CM

## 2016-03-12 NOTE — Progress Notes (Signed)
Subjective: 75 year old male persists the office they with his caregiver for follow-up evaluation of right hallux contusion, subungual hematoma. He is completing his course of Keflex made been soaking in Epson salts, Neosporin and a bandage. The finger the pain has gotten better but he does still have pain when he tried to put pressure to the toe. The redness has improved as well as the swelling. Denies any systemic complaints such as fevers, chills, nausea, vomiting. No acute changes since last appointment, and no other complaints at this time.   Objective: NAD DP/PT pulses palpable bilaterally, CRT less than 3 seconds Mild bilateral lower extremity edema On the right hallux there is subungual hematoma and the nail starting to loosen the underlying nailbed distally. There is decreased erythema on the nail border there is no ascending cellulitis. There is no drainage or pus expressed today. There is decreased tenderness to palpation the toenail. There is no other apparent areas of tenderness bilateral. No open lesions or pre-ulcerative lesions.  No pain with calf compression, swelling, warmth, erythema  Assessment: Right hallux subungual hematoma, with onycholysis  Plan: -All treatment options discussed with the patient including all alternatives, risks, complications.  -I was able to debride the distal portion the toenail today. Discussed total nail avulsion however his caregiver does not believe that he would be to tolerate the injection to anesthetize the toe. -Continue Keflex for now. -Continue Epson salt soaks daily. Cover with Neosporin and a bandage. -Discussed the toenail may fall off. -Monitor any signs or symptoms of infection of the exiting occur call the office. -Follow-up in 3 weeks or sooner if any problems arise. In the meantime, encouraged to call the office with any questions, concerns, change in symptoms.   Ovid Curd, DPM

## 2016-03-21 ENCOUNTER — Telehealth: Payer: Self-pay | Admitting: *Deleted

## 2016-03-21 NOTE — Telephone Encounter (Signed)
Male caller states she is calling for her husband, and he was to get an antibiotic.  I reviewed the 03/12/2016 clinical notes and pt was to continue the Keflex, I reviewed the Medication orders and pt has additional refills of the medication. I informed the male caller and she said she would have the dtr pickup.

## 2016-04-02 ENCOUNTER — Ambulatory Visit (INDEPENDENT_AMBULATORY_CARE_PROVIDER_SITE_OTHER): Payer: Medicare Other | Admitting: Podiatry

## 2016-04-02 DIAGNOSIS — L601 Onycholysis: Secondary | ICD-10-CM | POA: Diagnosis not present

## 2016-04-02 DIAGNOSIS — S90221A Contusion of right lesser toe(s) with damage to nail, initial encounter: Secondary | ICD-10-CM

## 2016-04-03 NOTE — Progress Notes (Signed)
Subjective: 75 year old male persists the office they with his caregiver for follow-up evaluation of right hallux contusion, subungual hematoma. He is still taking Keflex and has been soaking in Epson salts, Neosporin and a bandage. Caregiver states he seems to still be having some pain to the area but appears to be improved. No increase in redness and red streaks. No drainage or pus. Denies any systemic complaints such as fevers, chills, nausea, vomiting. No acute changes since last appointment, and no other complaints at this time.   Objective: NAD DP/PT pulses palpable bilaterally, CRT less than 3 seconds On the right hallux there is subungual hematoma and the nail starting to loosen the underlying nailbed distally. There is a minimal rim of erythema on the nail border there is no ascending cellulitis. There is no drainage or pus expressed today. I'm unable to press on the toenail today without eliciting any pain. The toenail does appear to be more loosely underlying nail bed. Upon debridement the nail did come off completely. No open lesions or pre-ulcerative lesions.  No pain with calf compression, swelling, warmth, erythema  Assessment: Right hallux subungual hematoma, with onycholysis  Plan: -All treatment options discussed with the patient including all alternatives, risks, complications.  -Nails debrided today and the nail did come off without any bleeding. Continue soaking in Epson salts and finish course of antibiotics. Cover with Neosporin and a Band-Aid. If not healed within 2 weeks call the office or sooner if needed. Monitor for infection. Call any questions or concerns in the meantime.  Ovid Curd, DPM

## 2016-04-16 ENCOUNTER — Ambulatory Visit: Payer: Medicare Other | Admitting: Podiatry

## 2017-08-07 DIAGNOSIS — G2581 Restless legs syndrome: Secondary | ICD-10-CM | POA: Diagnosis not present

## 2017-08-07 DIAGNOSIS — N433 Hydrocele, unspecified: Secondary | ICD-10-CM

## 2017-08-07 DIAGNOSIS — G40909 Epilepsy, unspecified, not intractable, without status epilepticus: Secondary | ICD-10-CM | POA: Diagnosis not present

## 2017-08-07 DIAGNOSIS — S72001A Fracture of unspecified part of neck of right femur, initial encounter for closed fracture: Secondary | ICD-10-CM | POA: Diagnosis not present

## 2017-08-07 DIAGNOSIS — N400 Enlarged prostate without lower urinary tract symptoms: Secondary | ICD-10-CM | POA: Diagnosis not present

## 2017-08-07 DIAGNOSIS — N390 Urinary tract infection, site not specified: Secondary | ICD-10-CM

## 2017-08-07 DIAGNOSIS — W19XXXA Unspecified fall, initial encounter: Secondary | ICD-10-CM | POA: Diagnosis not present

## 2017-08-08 DIAGNOSIS — N400 Enlarged prostate without lower urinary tract symptoms: Secondary | ICD-10-CM | POA: Diagnosis not present

## 2017-08-08 DIAGNOSIS — G40909 Epilepsy, unspecified, not intractable, without status epilepticus: Secondary | ICD-10-CM | POA: Diagnosis not present

## 2017-08-08 DIAGNOSIS — W19XXXA Unspecified fall, initial encounter: Secondary | ICD-10-CM | POA: Diagnosis not present

## 2017-08-08 DIAGNOSIS — Z01818 Encounter for other preprocedural examination: Secondary | ICD-10-CM

## 2017-08-08 DIAGNOSIS — G2581 Restless legs syndrome: Secondary | ICD-10-CM | POA: Diagnosis not present

## 2017-08-08 DIAGNOSIS — N433 Hydrocele, unspecified: Secondary | ICD-10-CM | POA: Diagnosis not present

## 2017-08-08 DIAGNOSIS — S72001A Fracture of unspecified part of neck of right femur, initial encounter for closed fracture: Secondary | ICD-10-CM | POA: Diagnosis not present

## 2017-08-08 DIAGNOSIS — N390 Urinary tract infection, site not specified: Secondary | ICD-10-CM | POA: Diagnosis not present

## 2017-08-09 DIAGNOSIS — S72001A Fracture of unspecified part of neck of right femur, initial encounter for closed fracture: Secondary | ICD-10-CM | POA: Diagnosis not present

## 2017-08-09 DIAGNOSIS — G2581 Restless legs syndrome: Secondary | ICD-10-CM | POA: Diagnosis not present

## 2017-08-09 DIAGNOSIS — N390 Urinary tract infection, site not specified: Secondary | ICD-10-CM | POA: Diagnosis not present

## 2017-08-09 DIAGNOSIS — N433 Hydrocele, unspecified: Secondary | ICD-10-CM | POA: Diagnosis not present

## 2017-08-09 DIAGNOSIS — W19XXXA Unspecified fall, initial encounter: Secondary | ICD-10-CM | POA: Diagnosis not present

## 2017-08-09 DIAGNOSIS — N400 Enlarged prostate without lower urinary tract symptoms: Secondary | ICD-10-CM | POA: Diagnosis not present

## 2017-08-09 DIAGNOSIS — G40909 Epilepsy, unspecified, not intractable, without status epilepticus: Secondary | ICD-10-CM | POA: Diagnosis not present

## 2017-08-10 DIAGNOSIS — S72001A Fracture of unspecified part of neck of right femur, initial encounter for closed fracture: Secondary | ICD-10-CM | POA: Diagnosis not present

## 2017-08-10 DIAGNOSIS — N433 Hydrocele, unspecified: Secondary | ICD-10-CM | POA: Diagnosis not present

## 2017-08-10 DIAGNOSIS — G40909 Epilepsy, unspecified, not intractable, without status epilepticus: Secondary | ICD-10-CM | POA: Diagnosis not present

## 2017-08-10 DIAGNOSIS — G2581 Restless legs syndrome: Secondary | ICD-10-CM | POA: Diagnosis not present

## 2017-08-10 DIAGNOSIS — N400 Enlarged prostate without lower urinary tract symptoms: Secondary | ICD-10-CM | POA: Diagnosis not present

## 2017-08-10 DIAGNOSIS — W19XXXA Unspecified fall, initial encounter: Secondary | ICD-10-CM | POA: Diagnosis not present

## 2017-08-10 DIAGNOSIS — N390 Urinary tract infection, site not specified: Secondary | ICD-10-CM | POA: Diagnosis not present

## 2017-08-11 DIAGNOSIS — N390 Urinary tract infection, site not specified: Secondary | ICD-10-CM | POA: Diagnosis not present

## 2017-08-11 DIAGNOSIS — S72001A Fracture of unspecified part of neck of right femur, initial encounter for closed fracture: Secondary | ICD-10-CM | POA: Diagnosis not present

## 2017-08-11 DIAGNOSIS — G40909 Epilepsy, unspecified, not intractable, without status epilepticus: Secondary | ICD-10-CM | POA: Diagnosis not present

## 2017-08-11 DIAGNOSIS — N433 Hydrocele, unspecified: Secondary | ICD-10-CM | POA: Diagnosis not present

## 2017-08-11 DIAGNOSIS — W19XXXA Unspecified fall, initial encounter: Secondary | ICD-10-CM | POA: Diagnosis not present

## 2017-08-11 DIAGNOSIS — N400 Enlarged prostate without lower urinary tract symptoms: Secondary | ICD-10-CM | POA: Diagnosis not present

## 2017-08-11 DIAGNOSIS — G2581 Restless legs syndrome: Secondary | ICD-10-CM | POA: Diagnosis not present

## 2017-08-12 DIAGNOSIS — N390 Urinary tract infection, site not specified: Secondary | ICD-10-CM | POA: Diagnosis not present

## 2017-08-12 DIAGNOSIS — N400 Enlarged prostate without lower urinary tract symptoms: Secondary | ICD-10-CM | POA: Diagnosis not present

## 2017-08-12 DIAGNOSIS — W19XXXA Unspecified fall, initial encounter: Secondary | ICD-10-CM | POA: Diagnosis not present

## 2017-08-12 DIAGNOSIS — S72001A Fracture of unspecified part of neck of right femur, initial encounter for closed fracture: Secondary | ICD-10-CM | POA: Diagnosis not present

## 2017-08-12 DIAGNOSIS — G40909 Epilepsy, unspecified, not intractable, without status epilepticus: Secondary | ICD-10-CM | POA: Diagnosis not present

## 2017-08-12 DIAGNOSIS — N433 Hydrocele, unspecified: Secondary | ICD-10-CM | POA: Diagnosis not present

## 2017-08-12 DIAGNOSIS — G2581 Restless legs syndrome: Secondary | ICD-10-CM | POA: Diagnosis not present

## 2017-08-13 DIAGNOSIS — S72001A Fracture of unspecified part of neck of right femur, initial encounter for closed fracture: Secondary | ICD-10-CM | POA: Diagnosis not present

## 2017-08-13 DIAGNOSIS — N390 Urinary tract infection, site not specified: Secondary | ICD-10-CM | POA: Diagnosis not present

## 2017-08-13 DIAGNOSIS — G2581 Restless legs syndrome: Secondary | ICD-10-CM | POA: Diagnosis not present

## 2017-08-13 DIAGNOSIS — N400 Enlarged prostate without lower urinary tract symptoms: Secondary | ICD-10-CM | POA: Diagnosis not present

## 2017-08-13 DIAGNOSIS — N433 Hydrocele, unspecified: Secondary | ICD-10-CM | POA: Diagnosis not present

## 2017-08-13 DIAGNOSIS — W19XXXA Unspecified fall, initial encounter: Secondary | ICD-10-CM | POA: Diagnosis not present

## 2017-08-13 DIAGNOSIS — G40909 Epilepsy, unspecified, not intractable, without status epilepticus: Secondary | ICD-10-CM | POA: Diagnosis not present

## 2017-09-12 IMAGING — CR DG CHEST 1V PORT
1 series · 1 of 1 positions shown · non-contrast
Comparison: Chest CT 03/04/2015 and earlier.

CLINICAL DATA: 74-year-old male with altered mental status,
lethargy and weakness. Initial encounter.

EXAM:
PORTABLE CHEST 1 VIEW

[AP]
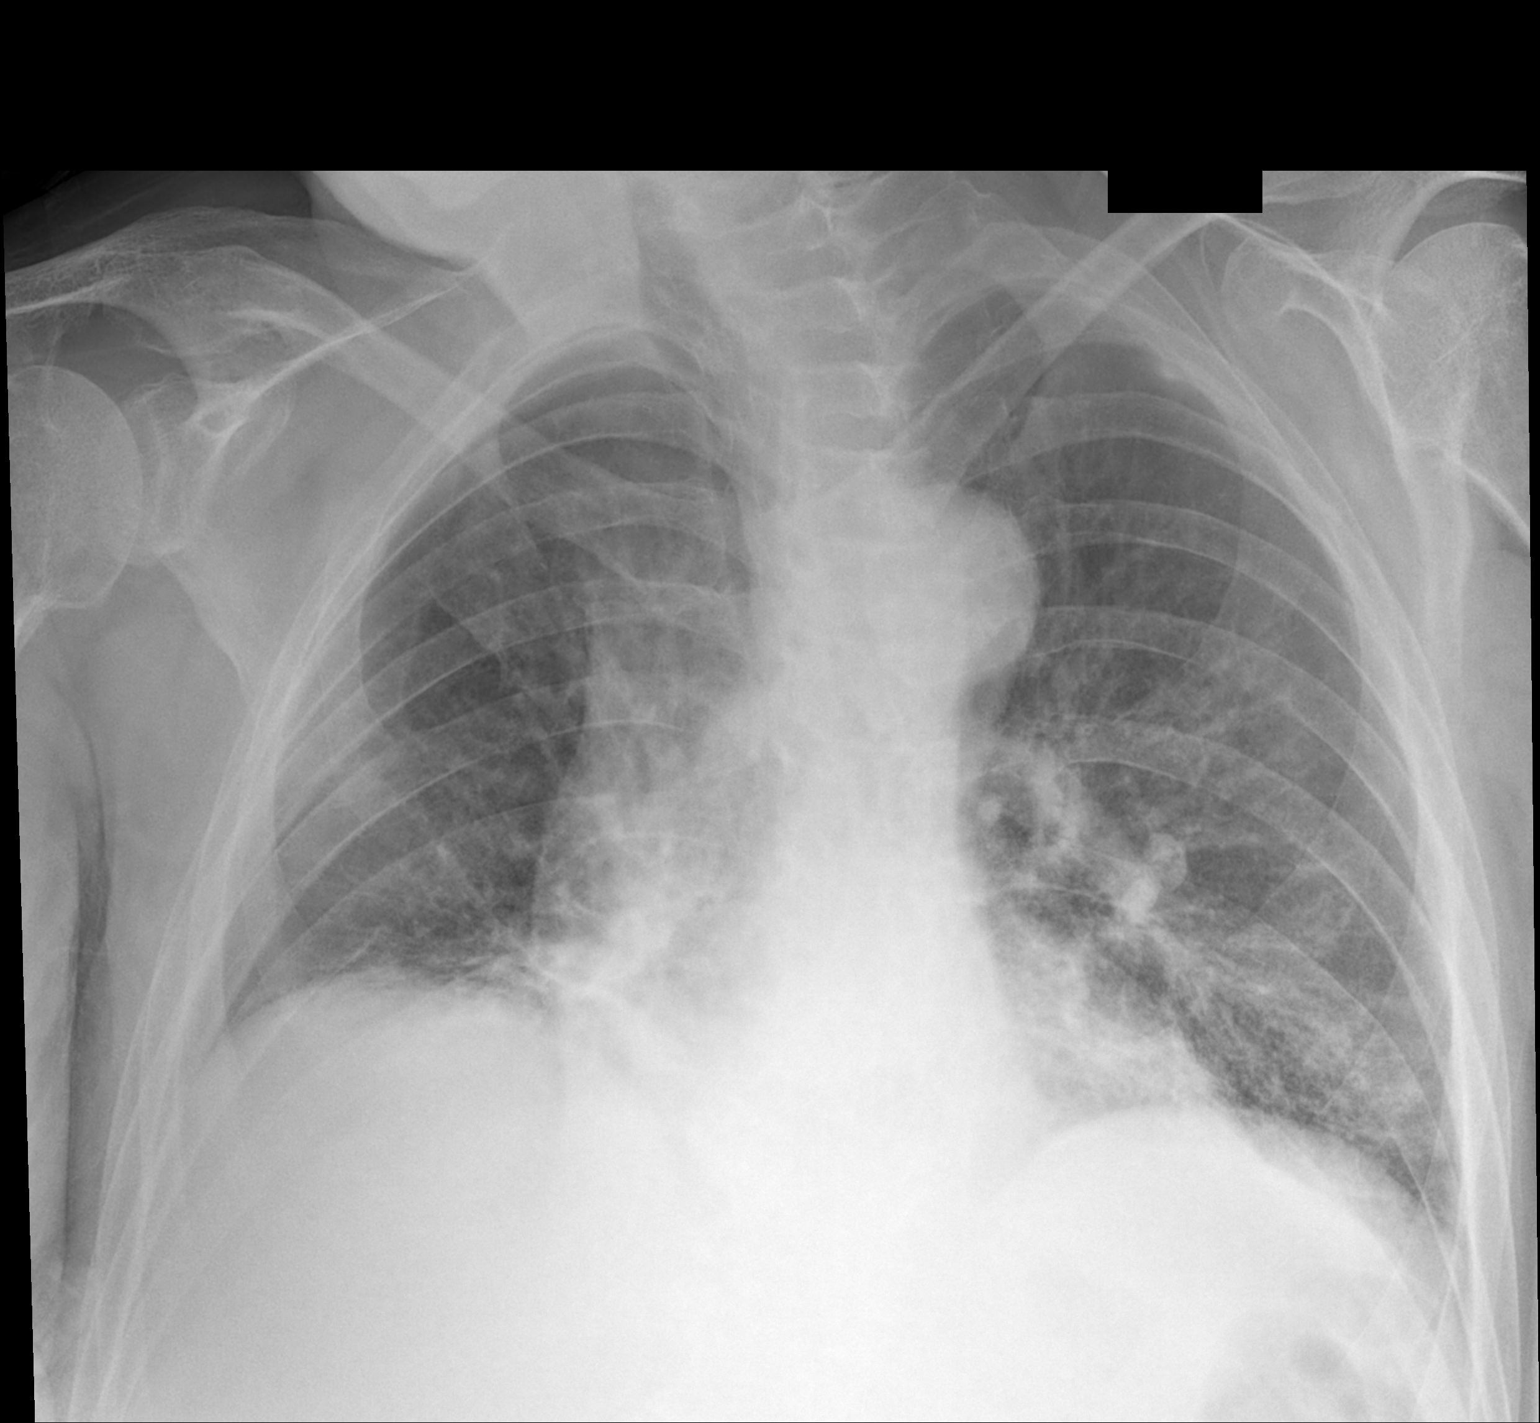

[1 of 1 positions shown; findings below may reference images not displayed]

FINDINGS: Portable AP semi upright view at 0048 hours. Continued low lung
volumes. Increased reticulonodular density at the left lung base. No
consolidation or pleural effusion. Stable cardiac size and
mediastinal contours. No pneumothorax.
IMPRESSION: Continued low lung volumes. Increased reticulonodular density at the
left lung base suspicious for acute aspiration or pneumonia in this
setting. No pleural effusion identified.

## 2022-10-12 ENCOUNTER — Emergency Department (HOSPITAL_COMMUNITY)
Admission: EM | Admit: 2022-10-12 | Discharge: 2022-10-12 | Disposition: A | Discharge status: Home / Self Care | Payer: Medicare PPO | Attending: Emergency Medicine | Admitting: Emergency Medicine

## 2022-10-12 ENCOUNTER — Emergency Department (HOSPITAL_COMMUNITY): Payer: Medicare PPO

## 2022-10-12 ENCOUNTER — Telehealth (HOSPITAL_COMMUNITY): Payer: Self-pay | Admitting: Student

## 2022-10-12 ENCOUNTER — Other Ambulatory Visit: Payer: Self-pay

## 2022-10-12 DIAGNOSIS — R600 Localized edema: Secondary | ICD-10-CM | POA: Insufficient documentation

## 2022-10-12 DIAGNOSIS — Z1152 Encounter for screening for COVID-19: Secondary | ICD-10-CM | POA: Diagnosis not present

## 2022-10-12 DIAGNOSIS — A419 Sepsis, unspecified organism: Secondary | ICD-10-CM | POA: Insufficient documentation

## 2022-10-12 DIAGNOSIS — J189 Pneumonia, unspecified organism: Secondary | ICD-10-CM

## 2022-10-12 DIAGNOSIS — J168 Pneumonia due to other specified infectious organisms: Secondary | ICD-10-CM | POA: Diagnosis not present

## 2022-10-12 DIAGNOSIS — R059 Cough, unspecified: Secondary | ICD-10-CM | POA: Diagnosis present

## 2022-10-12 DIAGNOSIS — R531 Weakness: Secondary | ICD-10-CM | POA: Diagnosis not present

## 2022-10-12 LAB — URINALYSIS, W/ REFLEX TO CULTURE (INFECTION SUSPECTED)
Bilirubin Urine: NEGATIVE
Glucose, UA: NEGATIVE mg/dL
Hgb urine dipstick: NEGATIVE
Ketones, ur: NEGATIVE mg/dL
Leukocytes,Ua: NEGATIVE
Nitrite: NEGATIVE
Protein, ur: NEGATIVE mg/dL
Specific Gravity, Urine: 1.011 (ref 1.005–1.030)
pH: 5 (ref 5.0–8.0)

## 2022-10-12 LAB — CBC WITH DIFFERENTIAL/PLATELET
Abs Immature Granulocytes: 0.11 10*3/uL — ABNORMAL HIGH (ref 0.00–0.07)
Basophils Absolute: 0 10*3/uL (ref 0.0–0.1)
Basophils Relative: 0 %
Eosinophils Absolute: 0 10*3/uL (ref 0.0–0.5)
Eosinophils Relative: 0 %
HCT: 39.4 % (ref 39.0–52.0)
Hemoglobin: 13.2 g/dL (ref 13.0–17.0)
Immature Granulocytes: 1 %
Lymphocytes Relative: 18 %
Lymphs Abs: 1.8 10*3/uL (ref 0.7–4.0)
MCH: 30.4 pg (ref 26.0–34.0)
MCHC: 33.5 g/dL (ref 30.0–36.0)
MCV: 90.8 fL (ref 80.0–100.0)
Monocytes Absolute: 1 10*3/uL (ref 0.1–1.0)
Monocytes Relative: 10 %
Neutro Abs: 7.3 10*3/uL (ref 1.7–7.7)
Neutrophils Relative %: 71 %
Platelets: 169 10*3/uL (ref 150–400)
RBC: 4.34 MIL/uL (ref 4.22–5.81)
RDW: 14.8 % (ref 11.5–15.5)
WBC: 10.2 10*3/uL (ref 4.0–10.5)
nRBC: 0 % (ref 0.0–0.2)

## 2022-10-12 LAB — BLOOD GAS, VENOUS
Acid-Base Excess: 1.6 mmol/L (ref 0.0–2.0)
Bicarbonate: 24.6 mmol/L (ref 20.0–28.0)
O2 Saturation: 89.9 %
Patient temperature: 37
pCO2, Ven: 33 mmHg — ABNORMAL LOW (ref 44–60)
pH, Ven: 7.48 — ABNORMAL HIGH (ref 7.25–7.43)
pO2, Ven: 54 mmHg — ABNORMAL HIGH (ref 32–45)

## 2022-10-12 LAB — COMPREHENSIVE METABOLIC PANEL
ALT: 12 U/L (ref 0–44)
AST: 18 U/L (ref 15–41)
Albumin: 3.2 g/dL — ABNORMAL LOW (ref 3.5–5.0)
Alkaline Phosphatase: 56 U/L (ref 38–126)
Anion gap: 12 (ref 5–15)
BUN: 15 mg/dL (ref 8–23)
CO2: 20 mmol/L — ABNORMAL LOW (ref 22–32)
Calcium: 8.9 mg/dL (ref 8.9–10.3)
Chloride: 105 mmol/L (ref 98–111)
Creatinine, Ser: 0.96 mg/dL (ref 0.61–1.24)
GFR, Estimated: >60 mL/min (ref >60)
Glucose, Bld: 104 mg/dL — ABNORMAL HIGH (ref 70–99)
Potassium: 3.7 mmol/L (ref 3.5–5.1)
Sodium: 137 mmol/L (ref 135–145)
Total Bilirubin: 0.5 mg/dL (ref 0.3–1.2)
Total Protein: 6.9 g/dL (ref 6.5–8.1)

## 2022-10-12 LAB — TROPONIN I (HIGH SENSITIVITY): Troponin I (High Sensitivity): 8 ng/L (ref <18)

## 2022-10-12 LAB — RESP PANEL BY RT-PCR (RSV, FLU A&B, COVID)  RVPGX2
Influenza A by PCR: NEGATIVE
Influenza B by PCR: NEGATIVE
Resp Syncytial Virus by PCR: NEGATIVE
SARS Coronavirus 2 by RT PCR: NEGATIVE

## 2022-10-12 LAB — LACTIC ACID, PLASMA: Lactic Acid, Venous: 1 mmol/L (ref 0.5–1.9)

## 2022-10-12 LAB — APTT: aPTT: 65 seconds — ABNORMAL HIGH (ref 24–36)

## 2022-10-12 LAB — PROTIME-INR
INR: 2.3 — ABNORMAL HIGH (ref 0.8–1.2)
Prothrombin Time: 25.1 seconds — ABNORMAL HIGH (ref 11.4–15.2)

## 2022-10-12 MED ORDER — LACTATED RINGERS IV SOLN: INTRAVENOUS | Status: DC

## 2022-10-12 MED ORDER — LEVOFLOXACIN 500 MG PO TABS: 500.0000 mg | ORAL_TABLET | Freq: Every day | ORAL | 0 refills | Status: DC

## 2022-10-12 MED ORDER — LEVOFLOXACIN 500 MG PO TABS
500.0000 mg | ORAL_TABLET | Freq: Once | ORAL | Status: AC
  Administered 2022-10-12: 500 mg via ORAL
  Filled 2022-10-12: qty 1

## 2022-10-12 MED ORDER — IOHEXOL 300 MG/ML  SOLN
100.0000 mL | Freq: Once | INTRAMUSCULAR | Status: AC | PRN
  Administered 2022-10-12: 100 mL via INTRAVENOUS

## 2022-10-12 MED ORDER — LEVOFLOXACIN 500 MG PO TABS
500.0000 mg | ORAL_TABLET | Freq: Every day | ORAL | 0 refills | Status: DC
Start: 2022-10-12 — End: 2024-03-05

## 2022-10-12 NOTE — ED Provider Notes (Signed)
[facility] EMERGENCY DEPARTMENT AT [facility] Provider Note   CSN: [id number] Arrival date & time: 10/12/22  1005     History  No chief complaint on file.   Alan Rhodes is a 82 y.o. male.  HPI Patient is at baseline nonverbal with unintelligible speech or echolalia type speech.  His caregiver is the primary historian.  Caregiver reports that the patient lives with her.  At baseline he does walk about and he can go into the bathroom with assistance.  She reports morning she had him going to the bathroom and had him seated on the toilet.  She went to go and do a couple chores and came back and he was listing off to the side and seemed somewhat poorly responsive.  She is able to call to him and he did straighten up but still seemed pretty lethargic and poorly responsive.  He did not have a collapse, fall or injury.  Patient does not have the verbal capacity to describe his symptoms.  She reports other than this he has been at baseline.  He does eat and take medications.    Home Medications Prior to Admission medications   Medication Sig Start Date End Date Taking? Authorizing Provider  levofloxacin (LEVAQUIN) 500 MG tablet Take 1 tablet (500 mg total) by mouth daily. 10/12/22  Yes Arby Barrette, MD  Calcium Carbonate-Vitamin D (CALCARB 600/D) 600-400 MG-UNIT per tablet Take 1 tablet by mouth daily.     [provider]  cephALEXin (KEFLEX) 500 MG capsule Take 1 capsule (500 mg total) by mouth 3 (three) times daily. 02/27/16   Vivi Barrack, DPM  ciclopirox (PENLAC) 8 % solution Apply topically at bedtime. Apply over nail and surrounding skin. Apply daily over previous coat. After seven (7) days, may remove with alcohol and continue cycle.    [provider]  divalproex (DEPAKOTE) 125 MG DR tablet Take 250-375 mg by mouth 2 (two) times daily. Take 2 tablets (250 mg) in the am & Take 3 tablets (375 mg) in the pm.    [provider]  folic acid  (FOLVITE) 1 MG tablet Take 1 mg by mouth daily.    [provider]  furosemide (LASIX) 20 MG tablet Take 20 mg by mouth daily.    [provider]  guaiFENesin (MUCINEX) 600 MG 12 hr tablet Take 600 mg by mouth 2 (two) times daily as needed for cough or to loosen phlegm.    [provider]  nystatin (MYCOSTATIN) 100000 UNIT/ML suspension Take 5 mLs (500,000 Units total) by mouth 4 (four) times daily. 10/16/15   Charm Rings, MD  oseltamivir (TAMIFLU) 75 MG capsule Take 1 capsule (75 mg total) by mouth 2 (two) times daily. 09/02/15   Rice, Jamesetta Orleans, MD  Oxcarbazepine (TRILEPTAL) 300 MG tablet Take 300 mg by mouth 2 (two) times daily.    [provider]  oxybutynin (DITROPAN) 5 MG tablet Take 10 mg by mouth 3 (three) times daily. At 8 am, 2 pm & 8 pm.    [provider]  pantoprazole (PROTONIX) 40 MG tablet Take 40 mg by mouth daily.    [provider]  polyethylene glycol powder (MIRALAX) powder Take 17 g by mouth daily.    [provider]  pravastatin (PRAVACHOL) 20 MG tablet Take 20 mg by mouth daily. 5 PM    [provider]  predniSONE (DELTASONE) 5 MG tablet Take 5 mg by mouth daily with breakfast.  [provider]  risperiDONE (RISPERDAL) 0.5 MG tablet Take 0.5 mg by mouth daily. At 4 pm.    [provider]  risperiDONE (RISPERDAL) 1 MG tablet Take 1 mg by mouth 2 (two) times daily. At 8 am & 8 pm.    [provider]  rivaroxaban (XARELTO) 20 MG TABS tablet Take 20 mg by mouth daily with supper.    [provider]  tamsulosin (FLOMAX) 0.4 MG CAPS capsule Take 0.4 mg by mouth at bedtime.    [provider]  terbinafine (LAMISIL) 250 MG tablet Take 250 mg by mouth daily.    [provider]      Allergies    Patient has no known allergies.    Review of Systems   Review of Systems  Physical Exam Updated Vital Signs BP (!) 171/84 (BP Location: Left Arm)   Pulse  65   Temp (!) 96.8 F (36 C) (Axillary)   Resp 16   Ht 5\' 3"  (1.6 m)   Wt 70 kg   SpO2 97%   BMI 27.34 kg/m  Physical Exam Constitutional:      Comments: Patient is somewhat debilitated in appearance.  No respiratory distress.  HENT:     Head: Normocephalic and atraumatic.     Mouth/Throat:     Mouth: Mucous membranes are moist.     Pharynx: Oropharynx is clear.  Eyes:     Extraocular Movements: Extraocular movements intact.  Cardiovascular:     Rate and Rhythm: Normal rate and regular rhythm.  Pulmonary:     Effort: Pulmonary effort is normal.     Breath sounds: Normal breath sounds.  Abdominal:     General: There is no distension.     Palpations: Abdomen is soft.     Tenderness: There is no abdominal tenderness. There is no guarding.  Genitourinary:    Penis: Normal.      Comments: No significant rash under the diaper around the penis or the scrotum.  Normal without any swelling or inflammation.  Perianal area examined.  No significant rash lesion or breakdown. Musculoskeletal:     Comments: Patient edema bilateral lower extremities.  Calf soft and pliable.  No wounds.  Skin:    General: Skin is warm and dry.  Neurological:     Comments: Patient is awake.  He has somewhat of a babbling type response to certain questions.  Not Really intelligible.  No focal or flaccid paralysis.  Patient does not really follow commands.     ED Results / Procedures / Treatments   Labs (all labs ordered are listed, but only abnormal results are displayed) Labs Reviewed  COMPREHENSIVE METABOLIC PANEL - Abnormal; Notable for the following components:      Result Value   CO2 20 (*)    Glucose, Bld 104 (*)    Albumin 3.2 (*)    All other components within normal limits  CBC WITH DIFFERENTIAL/PLATELET - Abnormal; Notable for the following components:   Abs Immature Granulocytes 0.11 (*)    All other components within normal limits  PROTIME-INR - Abnormal; Notable for the following  components:   Prothrombin Time 25.1 (*)    INR 2.3 (*)    All other components within normal limits  APTT - Abnormal; Notable for the following components:   aPTT 65 (*)    All other components within normal limits  BLOOD GAS, VENOUS - Abnormal; Notable for the following components:   pH, Ven 7.48 (*)  pCO2, Ven 33 (*)    pO2, Ven 54 (*)    All other components within normal limits  URINALYSIS, W/ REFLEX TO CULTURE (INFECTION SUSPECTED) - Abnormal; Notable for the following components:   Bacteria, UA RARE (*)    All other components within normal limits  RESP PANEL BY RT-PCR (RSV, FLU A&B, COVID)  RVPGX2  CULTURE, BLOOD (ROUTINE X 2)  CULTURE, BLOOD (ROUTINE X 2)  LACTIC ACID, PLASMA  TROPONIN I (HIGH SENSITIVITY)    EKG EKG Interpretation  Date/Time:  Friday October 12 2022 10:22:39 EDT Ventricular Rate:  67 PR Interval:  173 QRS Duration: 96 QT Interval:  405 QTC Calculation: 428 R Axis:   -16 Text Interpretation: Sinus rhythm Borderline left axis deviation Borderline low voltage, extremity leads no sig change from previous Confirmed by Arby Barrette [phone]) on 10/12/2022 4:12:06 PM  Radiology CT CHEST ABDOMEN PELVIS W CONTRAST  Addendum Date: 10/12/2022   ADDENDUM REPORT: 10/12/2022 14:54 ADDENDUM: Of note there is a stable 1 cm calcified nodule in the left mid lung perihilar, 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 No specific follow-up. Electronically Signed   By: Karen Kays M.D.   On: 10/12/2022 14:54   Result Date: 10/12/2022 CLINICAL DATA:  Sepsis EXAM: CT CHEST, ABDOMEN, AND PELVIS WITH CONTRAST TECHNIQUE: Multidetector CT imaging of the chest, abdomen and pelvis was performed following the standard protocol during bolus administration of intravenous contrast. RADIATION DOSE REDUCTION: This exam was performed according to the departmental dose-optimization program which includes automated exposure control, adjustment of the mA  and/or kV according to patient size and/or use of iterative reconstruction technique. CONTRAST:  OMNIPAQUE IOHEXOL 300 MG/ML  SOLN COMPARISON:  Chest x-ray earlier 10/12/2022. Older exams as well. Old CT scan chest without contrast 2014 FINDINGS: CT CHEST FINDINGS Cardiovascular: Heart is nonenlarged. No significant pericardial effusion. Coronary artery calcifications are seen. The thoracic aorta has a normal course and caliber vascular calcifications. Study limited by motion. Mediastinum/Nodes: Moderate hiatal hernia identified there is wall thickening seen along the distal esophagus with stranding of the adjacent fat. More proximally the esophagus is patulous. The hernia was seen previously. There is new stranding. Please correlate with any particular symptoms of esophagitis or other process. There is some areas of partial collapsed along the course of the trachea at the thoracic inlet which could be chronic indeterminate. There is debris along the right lower lobe airways. No specific abnormal lymph node enlargement identified in the axillary region, hilum or mediastinum. Lungs/Pleura: Trace pleural fluid. Extensive breathing motion. Centrilobular emphysematous changes are seen. There are areas of patchy ground-glass in both lungs. There is a consolidative area in the right lower lobe identified with what may be a cavitary component anteriorly on series 7, image 87. This masslike area of opacity measures a proximally 5.7 by 4.3 cm. Also areas collapsed along the middle lobe. Nodular area as well seen in the left lower lobe this was seen previously measuring 16 by 14 mm on series 7, image 91. Long-term stability. Similar appearance of the nodule in the superior segment of the right lower lobe medially measuring 13 x 14 mm. Also stable. This has some central fat density. No pneumothorax. Musculoskeletal: Multilevel compression deformities along the lower thoracic spine and upper lumbar spine with Schmorl's  node changes and bridging osteophytes. CT ABDOMEN PELVIS FINDINGS Hepatobiliary: Patent portal vein. No space-occupying liver lesion. The gallbladder is mildly distended. Pancreas: Moderate atrophy of the pancreas with some small  calcifications Hurst tail there is also a cystic area in the tail measuring 11 mm on series 3, image 50. Spleen: Normal in size without focal abnormality. Adrenals/Urinary Tract: The adrenal glands are preserved. Mild bilateral renal atrophy, left worse than right. There are punctate nonobstructing stones along each kidney. Prominent bilateral renal sinus fat. The ureters have normal course and caliber down to the bladder. Preserved contours of the urinary bladder. Stomach/Bowel: Redundant course of the sigmoid colon extending into the right upper quadrant anterior to the liver. Moderate colonic stool elsewhere. Possible surgical changes along the base of the cecum. The appendix is not clearly seen in the right lower quadrant. No pericecal stranding or fluid. The stomach is nondilated. Small bowel loops are nondilated. Vascular/Lymphatic: Normal caliber aorta and IVC. Mild atherosclerotic changes. No specific abnormal lymph node enlargement identified in the abdomen and pelvis. Reproductive: Prostate is unremarkable. Scrotal hydroceles are identified. Other: Evaluation limited by motion throughout the examination there is also streak artifact as the arms were scanned at the patient's side. Trace nonspecific ascites. Musculoskeletal: Compression deformity again seen at L2 greater than L4. Margins are sclerotic favor a chronic process. Multifocal degenerative changes of the spine and pelvis. There is streak artifact related to the patient's right hip arthroplasty. Cerclage wires are seen. IMPRESSION: Moderate hiatal hernia with patulous esophagus. There is wall thickening of the esophagus with some adjacent fat stranding. Please correlate with any symptoms including for esophagitis or other  process. 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 There is an adjacent masslike opacity in the right lower lobe with a possible cavitary component. Possibilities include infection including atypical infection versus a mass lesion. Based on appearance this could be lipoid pneumonia. Moderate colonic stool. Redundant course to the sigmoid colon extending into the right upper quadrant. No obstruction or free air. Trace ascites. Tiny pleural effusions. Scrotal hydroceles. Nonobstructing renal stones. Small cystic focus along the tail of the pancreas. Based on size and appearance recommend follow up in 1 year. This could be followed up with MRI/MRCP Evaluation significantly limited by motion and overlapping artifacts. Electronically Signed: By: Karen Kays M.D. On: 10/12/2022 14:04   CT Head Wo Contrast  Result Date: 10/12/2022 CLINICAL DATA:  Altered mental status EXAM: CT HEAD WITHOUT CONTRAST TECHNIQUE: Contiguous axial images were obtained from the base of the skull through the vertex without intravenous contrast. RADIATION DOSE REDUCTION: This exam was performed according to the departmental dose-optimization program which includes automated exposure control, adjustment of the mA and/or kV according to patient size and/or use of iterative reconstruction technique. COMPARISON:  CT head 08/07/17 FINDINGS: Brain: No evidence of acute infarction, hemorrhage, hydrocephalus, extra-axial collection or mass lesion/mass effect. Mild for age chronic microvascular ischemic change. Mineralization of the basal ganglia bilaterally Vascular: No hyperdense vessel or unexpected calcification. Skull: Normal. Negative for fracture or focal lesion. Sinuses/Orbits: No middle ear or mastoid effusion. Paranasal sinuses are notable mucosal thickening in the bilateral maxillary and ethmoid sinuses. Bilateral orbits are unremarkable. Other: None. IMPRESSION: No  acute intracranial abnormality. Electronically Signed   By: Lorenza Cambridge M.D.   On: 10/12/2022 13:43   DG Chest Port 1 View  Result Date: 10/12/2022 CLINICAL DATA:  Questionable sepsis, evaluate for abnormality. EXAM: PORTABLE CHEST 1 VIEW COMPARISON:  08/07/2017. FINDINGS: Low lung volumes. Patchy opacities in the lung bases, suspicious for aspiration or infection. No pleural effusion or pneumothorax. Gaseous distention of the interposed hepatic flexure. IMPRESSION: Patchy  opacities in the lung bases, suspicious for aspiration or infection. Electronically Signed   By: Orvan Falconer M.D.   On: 10/12/2022 11:02    Procedures Procedures    Medications Ordered in ED Medications  lactated ringers infusion (0 mLs Intravenous Stopped 10/12/22 1450)  iohexol (OMNIPAQUE) 300 MG/ML solution 100 mL (100 mLs Intravenous Contrast Given 10/12/22 1334)  levofloxacin (LEVAQUIN) tablet 500 mg (500 mg Oral Given 10/12/22 1445)    ED Course/ Medical Decision Making/ A&P                             Medical Decision Making Amount and/or Complexity of Data Reviewed Labs: ordered. Radiology: ordered. ECG/medicine tests: ordered.  Risk Prescription drug management.   Patient brought by EMS for report of episode of generalized weakness.  Additional history from patient's caregiver.  No apparent recent fever.  No apparent recent changes in baseline function.  Broad diagnostic evaluation initiated for possible sepsis\intracerebral hemorrhage\metabolic derangement.  Metabolic panel within normal limits.  GFR normal.  Lactic acid 1.0.  CBC normal with normal differential.  Urinalysis negative.  COVID influenza and RSV testing negative.  Venous pH 7.4.  Troponin 8.  INR 2.3.  CT head interpreted by radiology no acute findings.  CT chest abdomen pelvis for sepsis workup identified a focal lesion with some consolidation in the right lower lobe and some groundglass infiltrate bilaterally possibly  infectious.  Patient does not have any respiratory distress.  Oxygen saturations high 90s.  He is not tachycardic.  At this time I do not feel that he needs admission to the hospital.  He has one-on-one home care.  His caregiver reports that there has been some discussion of possible malignancy in the long but due to advanced age and comorbid conditions, there was not a plan for further treatment option.  At this time with patient back to baseline and able to take oral intake with no respiratory distress or apparent distress, will treat empirically with Levaquin for suspected pneumonia with close outpatient follow-up.  Discussed with the caregiver determinations with PCP regarding possible referral to pulmonology for further diagnostic evaluation of the consolidated lesion in the right lung versus empiric treatment and observation on outpatient basis.  Caregiver advises they will pursue follow-up PCP.          Final Clinical Impression(s) / ED Diagnoses Final diagnoses:  Pneumonia of right lower lobe due to infectious organism  General weakness    Rx / DC Orders ED Discharge Orders          Ordered    levofloxacin (LEVAQUIN) 500 MG tablet  Daily        10/12/22 1435              Arby Barrette, MD 10/12/22 1619

## 2022-10-12 NOTE — ED Notes (Signed)
Pt discharged home with caregiver. Discharge information discussed with caregiver. No s/s of distress observed during discharge.

## 2022-10-12 NOTE — ED Triage Notes (Signed)
Pt bib EMS. Caregiver called EMS stating pt was lethargic. He walks with assistance and went to the bathroom and was slumped over commode. He has been coughing and sleeping a lot. Observed with increase weakness this AM. He has been mumbling with words which is baseline. VS CBG: 118, B/P: 126/70, P: 70, O2 Sats 95%RA, R: 24. Capnography 35.

## 2022-10-12 NOTE — Discharge Instructions (Signed)
1.  You have been given a dose of Levaquin in the emergency department.  Continue the prescription tomorrow and take daily.  This is an antibiotic for pneumonia. 2.  Your CT scan shows an area that is suspicious for pneumonia but might represent a tumor or mass.  You must discuss this with your primary care doctor and discuss referral to a lung specialist for further evaluation. 3.  Try to stay hydrated with frequent small amounts of fluids.  Eat very small meals as tolerated. 4.  Return to emergency department if you develop a high fever, vomiting or other concerning changes.

## 2022-10-12 NOTE — Telephone Encounter (Signed)
Different pharmacy requested

## 2022-10-12 NOTE — ED Notes (Signed)
Patient transported to CT 

## 2022-10-13 LAB — CULTURE, BLOOD (ROUTINE X 2): Special Requests: ADEQUATE

## 2022-10-14 LAB — CULTURE, BLOOD (ROUTINE X 2): Culture: NO GROWTH

## 2022-10-15 LAB — CULTURE, BLOOD (ROUTINE X 2)

## 2022-10-16 LAB — CULTURE, BLOOD (ROUTINE X 2): Culture: NO GROWTH

## 2022-12-14 ENCOUNTER — Other Ambulatory Visit: Payer: Self-pay | Admitting: Internal Medicine

## 2022-12-14 DIAGNOSIS — R911 Solitary pulmonary nodule: Secondary | ICD-10-CM

## 2023-01-08 ENCOUNTER — Encounter: Payer: Self-pay | Admitting: Internal Medicine

## 2023-01-11 ENCOUNTER — Inpatient Hospital Stay: Admission: RE | Admit: 2023-01-11 | Payer: Medicare PPO | Source: Ambulatory Visit

## 2023-02-20 ENCOUNTER — Other Ambulatory Visit: Payer: Medicare PPO

## 2023-02-21 ENCOUNTER — Ambulatory Visit
Admission: RE | Admit: 2023-02-21 | Discharge: 2023-02-21 | Disposition: A | Discharge status: Home / Self Care | Payer: Medicare PPO | Source: Ambulatory Visit | Attending: Internal Medicine | Admitting: Internal Medicine

## 2023-02-21 DIAGNOSIS — R911 Solitary pulmonary nodule: Secondary | ICD-10-CM

## 2023-02-21 MED ORDER — IOPAMIDOL (ISOVUE-370) INJECTION 76%
500.0000 mL | Freq: Once | INTRAVENOUS | Status: AC | PRN
  Administered 2023-02-21: 75 mL via INTRAVENOUS

## 2024-03-01 ENCOUNTER — Emergency Department (HOSPITAL_COMMUNITY)
Admission: EM | Admit: 2024-03-01 | Discharge: 2024-03-01 | Disposition: A | Discharge status: Home / Self Care | Attending: Emergency Medicine | Admitting: Emergency Medicine

## 2024-03-01 ENCOUNTER — Other Ambulatory Visit: Payer: Self-pay

## 2024-03-01 ENCOUNTER — Emergency Department (HOSPITAL_COMMUNITY)

## 2024-03-01 DIAGNOSIS — Z7901 Long term (current) use of anticoagulants: Secondary | ICD-10-CM | POA: Diagnosis not present

## 2024-03-01 DIAGNOSIS — R262 Difficulty in walking, not elsewhere classified: Secondary | ICD-10-CM | POA: Diagnosis present

## 2024-03-01 DIAGNOSIS — W19XXXA Unspecified fall, initial encounter: Secondary | ICD-10-CM | POA: Insufficient documentation

## 2024-03-01 MED ORDER — ACETAMINOPHEN 325 MG PO TABS
650.0000 mg | ORAL_TABLET | Freq: Once | ORAL | Status: AC
  Administered 2024-03-01: 650 mg via ORAL
  Filled 2024-03-01: qty 2

## 2024-03-01 NOTE — ED Triage Notes (Signed)
 Patient to ED by POV with c/o unwitnessed fall, per caregiver he fell around 0600 this am was able to ambulate independently after fall and had no complaints until around 1500 when she noticed he was having difficulty walking. He takes Xarelto 20mg  daily. No sure if he hit head or LOC, caregiver believes L side may be affected from fall.

## 2024-03-01 NOTE — Discharge Instructions (Addendum)
 As we discussed, no injury was identified from the fall this morning. Please continue Tylenol  if needed. Return to the ED with any concerns.

## 2024-03-01 NOTE — ED Provider Notes (Signed)
 [facility]Y [facility]MENT AT [facility] Provider Note   CSN: [id number] Arrival date & time: 03/01/24  1624     Patient presents with: Alan Rhodes [name] is a 83 y.o. male.   Patient BIB his caregiver. History of autism, PTSD, bipolar, MR, nonverbal, seizures, RA, who lives at home with caregiver who has been with him since 2011. She reports she heard him yelling this morning around 6:00 am and found him on the floor. He uses a wheelchair but also is able to walk without assistive devices. She was able to get him up and he walked well without apparent injury. Later in the day, around 3:00 this afternoon, he refused to get up and walk. No change in mental status, nausea/vomiting. Caregiver is concerned for injury due to the fall. He is on Xarelto , caregiver believes, due to h/o DVT.  The history is provided by the patient. No language interpreter was used.  Fall       Prior to Admission medications   Medication Sig Start Date End Date Taking? Authorizing Provider  Calcium  Carbonate-Vitamin D  (CALCARB 600/D) 600-400 MG-UNIT per tablet Take 1 tablet by mouth daily.     [provider]  cephALEXin  (KEFLEX ) 500 MG capsule Take 1 capsule (500 mg total) by mouth 3 (three) times daily. 02/27/16   Gershon Donnice SAUNDERS, DPM  ciclopirox (PENLAC) 8 % solution Apply topically at bedtime. Apply over nail and surrounding skin. Apply daily over previous coat. After seven (7) days, may remove with alcohol and continue cycle.    [provider]  divalproex  (DEPAKOTE ) 125 MG DR tablet Take 250-375 mg by mouth 2 (two) times daily. Take 2 tablets (250 mg) in the am & Take 3 tablets (375 mg) in the pm.    [provider]  folic acid  (FOLVITE ) 1 MG tablet Take 1 mg by mouth daily.    [provider]  furosemide  (LASIX ) 20 MG tablet Take 20 mg by mouth daily.    [provider]  guaiFENesin (MUCINEX) 600 MG 12 hr tablet Take 600 mg by mouth 2  (two) times daily as needed for cough or to loosen phlegm.    [provider]  levofloxacin  (LEVAQUIN ) 500 MG tablet Take 1 tablet (500 mg total) by mouth daily. 10/12/22   Armenta Canning, MD  levofloxacin  (LEVAQUIN ) 500 MG tablet Take 1 tablet (500 mg total) by mouth daily. 10/12/22   Emelia Sluder, PA-C  nystatin  (MYCOSTATIN ) 100000 UNIT/ML suspension Take 5 mLs (500,000 Units total) by mouth 4 (four) times daily. 10/16/15   Diedra Rocky PARAS, MD  oseltamivir  (TAMIFLU ) 75 MG capsule Take 1 capsule (75 mg total) by mouth 2 (two) times daily. 09/02/15   Rice, Lonni LELON, MD  Oxcarbazepine  (TRILEPTAL ) 300 MG tablet Take 300 mg by mouth 2 (two) times daily.    [provider]  oxybutynin  (DITROPAN ) 5 MG tablet Take 10 mg by mouth 3 (three) times daily. At 8 am, 2 pm & 8 pm.    [provider]  pantoprazole  (PROTONIX ) 40 MG tablet Take 40 mg by mouth daily.    [provider]  polyethylene glycol powder (MIRALAX ) powder Take 17 g by mouth daily.    [provider]  pravastatin  (PRAVACHOL ) 20 MG tablet Take 20 mg by mouth daily. 5 PM    [provider]  predniSONE  (DELTASONE ) 5 MG tablet Take 5 mg by mouth daily with breakfast.    [provider]  risperiDONE  (RISPERDAL ) 0.5 MG tablet Take 0.5 mg by mouth daily. At 4 pm.    [provider]  risperiDONE  (RISPERDAL ) 1 MG tablet Take 1 mg by mouth 2 (two) times daily. At 8 am & 8 pm.    [provider]  rivaroxaban  (XARELTO ) 20 MG TABS tablet Take 20 mg by mouth daily with supper.    [provider]  tamsulosin  (FLOMAX ) 0.4 MG CAPS capsule Take 0.4 mg by mouth at bedtime.    [provider]  terbinafine (LAMISIL) 250 MG tablet Take 250 mg by mouth daily.    [provider]    Allergies: Patient has no known allergies.    Review of Systems  Updated Vital Signs BP (!) 150/94 (BP Location: Right Arm)   Pulse 76   Temp 98 F (36.7 C) (Oral)   Resp 16    Ht 5' 3 (1.6 m)   Wt 74 kg   SpO2 95%   BMI 28.90 kg/m   Physical Exam Vitals and nursing note reviewed.  Constitutional:      General: He is not in acute distress. HENT:     Head: Atraumatic.  Cardiovascular:     Rate and Rhythm: Normal rate.  Pulmonary:     Effort: Pulmonary effort is normal.     Comments: No bruising of chest wall.  Chest:     Chest wall: No tenderness.  Abdominal:     General: There is no distension.     Palpations: Abdomen is soft.     Comments: No bruising of abdominal wall.   Musculoskeletal:        General: Normal range of motion.     Cervical back: Normal range of motion and neck supple.     Comments: No deformities. No LE shortening or rotation. No bruising of the back.   Skin:    General: Skin is warm and dry.     Findings: No bruising.  Neurological:     Mental Status: He is alert.     Comments: He is awake. Nonverbal (baseline).      (all labs ordered are listed, but only abnormal results are displayed) Labs Reviewed - No data to display  EKG: None  Radiology: DG Pelvis Portable Result Date: 03/01/2024 CLINICAL DATA:  Fall. EXAM: PORTABLE PELVIS 1-2 VIEWS COMPARISON:  Right hip radiograph dated 08/09/2017. 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 The arthroplasty components appear intact. No acute fracture or dislocation. The bones are osteopenic. Heterotopic ossification adjacent to the proximal right femur. The soft tissues are unremarkable. IMPRESSION: 1. No acute fracture or dislocation. 2. Right hip arthroplasty. Electronically Signed   By: Vanetta Chou M.D.   On: 03/01/2024 18:39     Procedures   Medications Ordered in the ED  acetaminophen  (TYLENOL ) tablet 650 mg (has no administration in time range)    Clinical Course as of 03/01/24 1930  Sun Mar 01, 2024  1912 Here with caregiver after fall this morning, initially seemed to walk normally but later in the day she felt he was not bearing weight as per his usual.    Pelvis xray negative, showing right total hip, but no fracture. He is ambulated in the room and caregiver states appears some better but still not moving as he usually does.  [SU]  1926 Patient is seen by Dr. Freddi. Caregiver expresses she feels he seems improved. No further evaluation indicated tonight. REturn precautions discussed.  [SU]    Clinical Course User Index [  SU] Alan Balls, PA-C                                 Medical Decision Making Amount and/or Complexity of Data Reviewed Radiology: ordered.        Final diagnoses:  Fall, initial encounter    ED Discharge Orders     None          Alan Rhodes, Alan Rhodes 03/01/24 1930    Freddi Hamilton, MD 03/03/24 2018

## 2024-03-02 ENCOUNTER — Emergency Department (HOSPITAL_COMMUNITY)

## 2024-03-02 ENCOUNTER — Observation Stay (HOSPITAL_COMMUNITY)
Admission: EM | Admit: 2024-03-02 | Discharge: 2024-03-05 | Disposition: A | Discharge status: Home Health | Attending: Internal Medicine | Admitting: Internal Medicine

## 2024-03-02 ENCOUNTER — Encounter (HOSPITAL_COMMUNITY): Payer: Self-pay | Admitting: Internal Medicine

## 2024-03-02 DIAGNOSIS — F840 Autistic disorder: Secondary | ICD-10-CM | POA: Diagnosis not present

## 2024-03-02 DIAGNOSIS — Z86718 Personal history of other venous thrombosis and embolism: Secondary | ICD-10-CM | POA: Diagnosis not present

## 2024-03-02 DIAGNOSIS — N400 Enlarged prostate without lower urinary tract symptoms: Secondary | ICD-10-CM | POA: Diagnosis not present

## 2024-03-02 DIAGNOSIS — G8192 Hemiplegia, unspecified affecting left dominant side: Secondary | ICD-10-CM | POA: Diagnosis present

## 2024-03-02 DIAGNOSIS — F319 Bipolar disorder, unspecified: Secondary | ICD-10-CM | POA: Diagnosis not present

## 2024-03-02 DIAGNOSIS — R531 Weakness: Secondary | ICD-10-CM | POA: Diagnosis not present

## 2024-03-02 DIAGNOSIS — E785 Hyperlipidemia, unspecified: Secondary | ICD-10-CM | POA: Insufficient documentation

## 2024-03-02 DIAGNOSIS — R29898 Other symptoms and signs involving the musculoskeletal system: Principal | ICD-10-CM

## 2024-03-02 DIAGNOSIS — G40909 Epilepsy, unspecified, not intractable, without status epilepticus: Secondary | ICD-10-CM | POA: Insufficient documentation

## 2024-03-02 DIAGNOSIS — M069 Rheumatoid arthritis, unspecified: Secondary | ICD-10-CM | POA: Insufficient documentation

## 2024-03-02 DIAGNOSIS — R32 Unspecified urinary incontinence: Secondary | ICD-10-CM | POA: Diagnosis not present

## 2024-03-02 LAB — COMPREHENSIVE METABOLIC PANEL WITH GFR
ALT: 5 U/L (ref 0–44)
AST: 19 U/L (ref 15–41)
Albumin: 4.1 g/dL (ref 3.5–5.0)
Alkaline Phosphatase: 64 U/L (ref 38–126)
Anion gap: 14 (ref 5–15)
BUN: 23 mg/dL (ref 8–23)
CO2: 25 mmol/L (ref 22–32)
Calcium: 10.1 mg/dL (ref 8.9–10.3)
Chloride: 101 mmol/L (ref 98–111)
Creatinine, Ser: 1.11 mg/dL (ref 0.61–1.24)
GFR, Estimated: >60 mL/min (ref >60)
Glucose, Bld: 109 mg/dL — ABNORMAL HIGH (ref 70–99)
Potassium: 4.2 mmol/L (ref 3.5–5.1)
Sodium: 139 mmol/L (ref 135–145)
Total Bilirubin: 0.3 mg/dL (ref 0.0–1.2)
Total Protein: 6.7 g/dL (ref 6.5–8.1)

## 2024-03-02 LAB — CBC WITH DIFFERENTIAL/PLATELET
Abs Immature Granulocytes: 0.03 K/uL (ref 0.00–0.07)
Basophils Absolute: 0 K/uL (ref 0.0–0.1)
Basophils Relative: 0 %
Eosinophils Absolute: 0 K/uL (ref 0.0–0.5)
Eosinophils Relative: 0 %
HCT: 44.7 % (ref 39.0–52.0)
Hemoglobin: 14.5 g/dL (ref 13.0–17.0)
Immature Granulocytes: 0 %
Lymphocytes Relative: 45 %
Lymphs Abs: 3.5 K/uL (ref 0.7–4.0)
MCH: 29.6 pg (ref 26.0–34.0)
MCHC: 32.4 g/dL (ref 30.0–36.0)
MCV: 91.2 fL (ref 80.0–100.0)
Monocytes Absolute: 0.4 K/uL (ref 0.1–1.0)
Monocytes Relative: 5 %
Neutro Abs: 3.9 K/uL (ref 1.7–7.7)
Neutrophils Relative %: 50 %
Platelets: 152 K/uL (ref 150–400)
RBC: 4.9 MIL/uL (ref 4.22–5.81)
RDW: 14.7 % (ref 11.5–15.5)
WBC: 7.9 K/uL (ref 4.0–10.5)
nRBC: 0 % (ref 0.0–0.2)

## 2024-03-02 MED ORDER — STROKE: EARLY STAGES OF RECOVERY BOOK
Freq: Once | Status: DC
  Filled 2024-03-02 (×2): qty 1

## 2024-03-02 MED ORDER — ACETAMINOPHEN 325 MG PO TABS
650.0000 mg | ORAL_TABLET | Freq: Once | ORAL | Status: AC
  Administered 2024-03-02: 650 mg via ORAL
  Filled 2024-03-02: qty 2

## 2024-03-02 MED ORDER — ACETAMINOPHEN 160 MG/5ML PO SOLN: 650.0000 mg | ORAL | Status: DC | PRN

## 2024-03-02 MED ORDER — ACETAMINOPHEN 650 MG RE SUPP: 650.0000 mg | RECTAL | Status: DC | PRN

## 2024-03-02 MED ORDER — ACETAMINOPHEN 325 MG PO TABS
650.0000 mg | ORAL_TABLET | ORAL | Status: DC | PRN
  Administered 2024-03-03: 650 mg via ORAL
  Filled 2024-03-02: qty 2

## 2024-03-02 NOTE — ED Provider Notes (Signed)
     Accepted handoff at shift change from Jamie PA-C. Please see prior provider note for more detail.   Briefly: Patient is 83 y.o.  presenting to emergency room with follow-up on unwitnessed fall yesterday.  Per caregiver patient was found down on the ground yesterday morning.  He was evaluated here yesterday and had normal x-ray of left hip and pelvis.  He is able to ambulate but appears to be falling to the right side and dragging left leg and needing assistance walking. unsure if this is secondary to weakness or pain.  She also notices some apparent weakness of his left arm while reaching for a phone earlier.  He is anticoagulated with Xarelto .  At baseline he is able to walk with steady gait and with no assistance.   Plan:  - patient admitted to Dr. Franky who requested a neurology consultation prior to admission. - I consulted with Dr. Merrianne who recommended MRI in the morning at [facility] - no need for transportation to [facility] given that patient is not thrombectomy candidate given unknown LNK. - I shared this information with Dr. Franky who agrees to continue with admitting patient for MRI.    Hoy Nidia FALCON, [place] 03/02/24 2302    Randol Simmonds, MD 03/03/24 JUDITHANN

## 2024-03-02 NOTE — ED Triage Notes (Signed)
 Pt came in from home via EMS w/ c/o of a unwitnessed fall. Evaluated at [facility] with negative scans. Caregiver noticed a limp and weakness in left hand today. Pt nonverbal at baseline.

## 2024-03-02 NOTE — ED Provider Notes (Signed)
 [place] EMERGENCY DEPARTMENT AT [facility] Provider Note   CSN: [id number] Arrival date & time: 03/02/24  1930     Patient presents with: Alan Rhodes is a 82 y.o. male with past medical history of seizure disorder, autism, bipolar disorder, PTSD is presenting to emergency room with follow-up on unwitnessed fall yesterday.  Per caregiver patient was found down on the ground yesterday morning.  He was evaluated here yesterday and had normal x-ray of left hip and pelvis.  He is able to ambulate but appears to be falling to the right side and dragging left leg and needing assistance walking. unsure if this is secondary to weakness or pain.  She also notices some apparent weakness of his left arm while reaching for a phone earlier.  He is anticoagulated with Xarelto .  At baseline he is able to walk with steady gait and with no assistance.  {Add pertinent medical, surgical, social history, OB history to YEP:67052}  Fall       Prior to Admission medications   Medication Sig Start Date End Date Taking? Authorizing Provider  Calcium  Carbonate-Vitamin D  (CALCARB 600/D) 600-400 MG-UNIT per tablet Take 1 tablet by mouth daily.     [provider]  cephALEXin  (KEFLEX ) 500 MG capsule Take 1 capsule (500 mg total) by mouth 3 (three) times daily. 02/27/16   Gershon Donnice SAUNDERS, DPM  ciclopirox (PENLAC) 8 % solution Apply topically at bedtime. Apply over nail and surrounding skin. Apply daily over previous coat. After seven (7) days, may remove with alcohol and continue cycle.    [provider]  divalproex  (DEPAKOTE ) 125 MG DR tablet Take 250-375 mg by mouth 2 (two) times daily. Take 2 tablets (250 mg) in the am & Take 3 tablets (375 mg) in the pm.    [provider]  folic acid  (FOLVITE ) 1 MG tablet Take 1 mg by mouth daily.    [provider]  furosemide  (LASIX ) 20 MG tablet Take 20 mg by mouth daily.    [provider]   guaiFENesin (MUCINEX) 600 MG 12 hr tablet Take 600 mg by mouth 2 (two) times daily as needed for cough or to loosen phlegm.    [provider]  levofloxacin  (LEVAQUIN ) 500 MG tablet Take 1 tablet (500 mg total) by mouth daily. 10/12/22   Armenta Canning, MD  levofloxacin  (LEVAQUIN ) 500 MG tablet Take 1 tablet (500 mg total) by mouth daily. 10/12/22   Emelia Sluder, PA-C  nystatin  (MYCOSTATIN ) 100000 UNIT/ML suspension Take 5 mLs (500,000 Units total) by mouth 4 (four) times daily. 10/16/15   Diedra Rocky PARAS, MD  oseltamivir  (TAMIFLU ) 75 MG capsule Take 1 capsule (75 mg total) by mouth 2 (two) times daily. 09/02/15   Rice, Alan LELON, MD  Oxcarbazepine  (TRILEPTAL ) 300 MG tablet Take 300 mg by mouth 2 (two) times daily.    [provider]  oxybutynin  (DITROPAN ) 5 MG tablet Take 10 mg by mouth 3 (three) times daily. At 8 am, 2 pm & 8 pm.    [provider]  pantoprazole  (PROTONIX ) 40 MG tablet Take 40 mg by mouth daily.    [provider]  polyethylene glycol powder (MIRALAX ) powder Take 17 g by mouth daily.    [provider]  pravastatin  (PRAVACHOL ) 20 MG tablet Take 20 mg by mouth daily. 5 PM    [provider]  predniSONE  (DELTASONE ) 5 MG tablet Take 5 mg by mouth daily with breakfast.  [provider]  risperiDONE  (RISPERDAL ) 0.5 MG tablet Take 0.5 mg by mouth daily. At 4 pm.    [provider]  risperiDONE  (RISPERDAL ) 1 MG tablet Take 1 mg by mouth 2 (two) times daily. At 8 am & 8 pm.    [provider]  rivaroxaban  (XARELTO ) 20 MG TABS tablet Take 20 mg by mouth daily with supper.    [provider]  tamsulosin  (FLOMAX ) 0.4 MG CAPS capsule Take 0.4 mg by mouth at bedtime.    [provider]  terbinafine (LAMISIL) 250 MG tablet Take 250 mg by mouth daily.    [provider]    Allergies: Patient has no known allergies.    Review of Systems  Musculoskeletal:  Positive for gait problem.     Updated Vital Signs BP (!) 154/125   Pulse 68   Temp (!) 97.3 F (36.3 C) (Axillary)   Resp 17   SpO2 99%   Physical Exam Vitals and nursing note reviewed.  Constitutional:      General: He is not in acute distress.    Appearance: He is not toxic-appearing.  HENT:     Head: Normocephalic and atraumatic.  Eyes:     General: No scleral icterus.    Conjunctiva/sclera: Conjunctivae normal.  Cardiovascular:     Rate and Rhythm: Normal rate and regular rhythm.     Pulses: Normal pulses.     Heart sounds: Normal heart sounds.  Pulmonary:     Effort: Pulmonary effort is normal. No respiratory distress.     Breath sounds: Normal breath sounds.  Abdominal:     General: Abdomen is flat. Bowel sounds are normal.     Palpations: Abdomen is soft.     Tenderness: There is no abdominal tenderness.  Musculoskeletal:     Right lower leg: No edema.     Left lower leg: No edema.  Skin:    General: Skin is warm and dry.     Findings: No lesion.  Neurological:     General: No focal deficit present.     Mental Status: He is alert and oriented to person, place, and time. Mental status is at baseline.     Comments: Follows basic commands - baseline.  Mumbles.  Ambulated with unsteady gait, needs to hold on to caregiver.  No facial droop.      (all labs ordered are listed, but only abnormal results are displayed) Labs Reviewed  COMPREHENSIVE METABOLIC PANEL WITH GFR - Abnormal; Notable for the following components:      Result Value   Glucose, Bld 109 (*)    All other components within normal limits  CBC WITH DIFFERENTIAL/PLATELET  URINALYSIS, ROUTINE W REFLEX MICROSCOPIC    EKG: None  Radiology: CT Head Wo Contrast Result Date: 03/02/2024 CLINICAL DATA:  Head trauma, moderate-severe; Neck trauma (Age >= 65y) EXAM: CT HEAD WITHOUT CONTRAST CT CERVICAL SPINE WITHOUT CONTRAST TECHNIQUE: Multidetector CT imaging of the head and cervical spine was performed following the standard  protocol without intravenous contrast. Multiplanar CT image reconstructions of the cervical spine were also generated. RADIATION DOSE REDUCTION: This exam was performed according to the departmental dose-optimization program which includes automated exposure control, adjustment of the mA and/or kV according to patient size and/or use of iterative reconstruction technique. COMPARISON:  None Available. FINDINGS: CT HEAD FINDINGS Brain: No evidence of acute infarction, hemorrhage, hydrocephalus, extra-axial collection or mass lesion/mass effect. Vascular: No hyperdense vessel. Skull: No acute fracture. Sinuses/Orbits: Clear sinuses.  No  acute orbital findings. Other: No mastoid effusions. CT CERVICAL SPINE FINDINGS Alignment: Straightening.  No substantial sagittal subluxation. Skull base and vertebrae: No acute fracture. No primary bone lesion or focal pathologic process. Soft tissues and spinal canal: No prevertebral fluid or swelling. No visible canal hematoma. Disc levels: Moderate to severe multilevel degenerative change, greatest at C5-C6 where there is endplate spurring that results in bilateral foraminal narrowing. Upper chest: Lung apices are clear. IMPRESSION: 1. No evidence of acute intracranial abnormality. 2. No evidence of acute fracture or traumatic malalignment in the cervical spine. Electronically Signed   By: Gilmore GORMAN Molt M.D.   On: 03/02/2024 21:19   CT Cervical Spine Wo Contrast Result Date: 03/02/2024 CLINICAL DATA:  Head trauma, moderate-severe; Neck trauma (Age >= 65y) EXAM: CT HEAD WITHOUT CONTRAST CT CERVICAL SPINE WITHOUT CONTRAST TECHNIQUE: Multidetector CT imaging of the head and cervical spine was performed following the standard protocol without intravenous contrast. Multiplanar CT image reconstructions of the cervical spine were also generated. RADIATION DOSE REDUCTION: This exam was performed according to the departmental dose-optimization program which includes automated  exposure control, adjustment of the mA and/or kV according to patient size and/or use of iterative reconstruction technique. COMPARISON:  None Available. FINDINGS: CT HEAD FINDINGS Brain: No evidence of acute infarction, hemorrhage, hydrocephalus, extra-axial collection or mass lesion/mass effect. Vascular: No hyperdense vessel. Skull: No acute fracture. Sinuses/Orbits: Clear sinuses.  No acute orbital findings. Other: No mastoid effusions. CT CERVICAL SPINE FINDINGS Alignment: Straightening.  No substantial sagittal subluxation. Skull base and vertebrae: No acute fracture. No primary bone lesion or focal pathologic process. Soft tissues and spinal canal: No prevertebral fluid or swelling. No visible canal hematoma. Disc levels: Moderate to severe multilevel degenerative change, greatest at C5-C6 where there is endplate spurring that results in bilateral foraminal narrowing. Upper chest: Lung apices are clear. IMPRESSION: 1. No evidence of acute intracranial abnormality. 2. No evidence of acute fracture or traumatic malalignment in the cervical spine. Electronically Signed   By: Gilmore GORMAN Molt M.D.   On: 03/02/2024 21:19   DG Knee Complete 4 Views Left Result Date: 03/02/2024 CLINICAL DATA:  Recent fall with left knee pain, initial encounter EXAM: LEFT KNEE - COMPLETE 4+ VIEW COMPARISON:  None Available. FINDINGS: Tricompartmental degenerative changes are noted. No acute fracture or dislocation is seen. Vascular calcifications are noted. No joint effusion is noted. IMPRESSION: Degenerative change without acute abnormality. Electronically Signed   By: Oneil Devonshire M.D.   On: 03/02/2024 21:06   DG Pelvis Portable Result Date: 03/01/2024 CLINICAL DATA:  Fall. EXAM: PORTABLE PELVIS 1-2 VIEWS COMPARISON:  Right hip radiograph dated 08/09/2017. 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 The arthroplasty components appear intact. No acute fracture or dislocation. The bones are osteopenic. Heterotopic  ossification adjacent to the proximal right femur. The soft tissues are unremarkable. IMPRESSION: 1. No acute fracture or dislocation. 2. Right hip arthroplasty. Electronically Signed   By: Vanetta Chou M.D.   On: 03/01/2024 18:39    {Document cardiac monitor, telemetry assessment procedure when appropriate:32947} Procedures   Medications Ordered in the ED - No data to display    {Click here for ABCD2, HEART and other calculators REFRESH Note before signing:1}                              Medical Decision Making Amount and/or Complexity of Data Reviewed Labs: ordered. Radiology: ordered.  Risk OTC drugs.   This patient presents  to the ED for concern of fall, this involves an extensive number of treatment options, and is a complaint that carries with it a high risk of complications and morbidity.  The differential diagnosis includes CVA, fracture, strain    Co morbidities that complicate the patient evaluation  Intellectual disability Seizure disorder Autism    Additional history obtained:  Additional history obtained from 03/01/24 ED visit for similar thing, negative x-ray of hip/pelvis    Lab Tests:  I personally interpreted labs.  The pertinent results include:   No leukocytosis.  Does not really get it.  CMP shows no electrolyte abnormality.  Normal kidney and liver function.   Imaging Studies ordered:  I ordered imaging studies including CT head, cervical spine. X-ray of left knee.   I independently visualized and interpreted imaging which showed no acute findings.  I agree with the radiologist interpretation   Cardiac Monitoring: / EKG:  The patient was maintained on a cardiac monitor.     Consultations Obtained:  I requested consultation with the hospitalist,  and discussed lab and imaging findings as well as pertinent plan - would like to admit for MR to rule out stroke.    Problem List / ED Course / Critical interventions / Medication  management  *** I ordered medication including Tylenol  for pain Reevaluation of the patient after these medicines showed that the patient stayed the same Unfortunately MRI is not available at this time.  Feel he would benefit from admission to have MRI of the brain to rule out acute stroke. Him and family member are agreeable to this. [place] PA-c in sign off pending consult to neurology.      {Document critical care time when appropriate  Document review of labs and clinical decision tools ie CHADS2VASC2, etc  Document your independent review of radiology images and any outside records  Document your discussion with family members, caretakers and with consultants  Document social determinants of health affecting pt's care  Document your decision making why or why not admission, treatments were needed:32947:::1}   Final diagnoses:  None    ED Discharge Orders     None

## 2024-03-02 NOTE — H&P (Signed)
 History and Physical    Alan Rhodes FMW:[id number] DOB: [birth year] DOA: 03/02/2024  Patient coming from: At home with caregiver  Chief Complaint: Left-sided weakness.  HPI: Alan Rhodes is a 83 y.o. male with history of intellectual disability/autism, rheumatoid arthritis, seizure disorder, bipolar disorder, hyperlipidemia, history of DVT was brought to the ER after patient was found to be weak on the left side and limping.  Per patient's caregiver who was at the bedside patient had a fall yesterday and was brought to the ER x-rays did not show any fracture of the left knee or pelvis.  This morning the caregiver took patient to daycare and at around evening when she went back to pick him up, the daycare person told the caregiver that patient has not been walking the whole day and has been limping when he tries to.  He was brought to the ER.  Patient is noncommunicative.  Does not provide any history.  ED Course: In the ER on ambulating patient is limping on the left side with unsteadiness.  But does not seem to have any pain.  CT head and C-spine were negative.  Labs are largely unremarkable.  ER physician discussed with Dr. Merrianne on-call neurologist who recommended getting MRI brain and if patient is positive for stroke then further workup.  Review of Systems: As per HPI, rest all negative.   Past Medical History:  Diagnosis Date   Autism    Bipolar disorder, unspecified (HCC)    Bladder incontinence    Constipation    Incontinence of bowel    Profound mental retardation    PTSD (post-traumatic stress disorder)    Rheumatoid arteritis (HCC)    Seizure disorder (HCC)     Past Surgical History:  Procedure Laterality Date   NO PAST SURGERIES       reports that he has never smoked. He has never used smokeless tobacco. He reports that he does not drink alcohol and does not use drugs.  No Known Allergies  Family History  Family history unknown: Yes    Prior to  Admission medications   Medication Sig Start Date End Date Taking? Authorizing Provider  Calcium  Carbonate-Vitamin D  (CALCARB 600/D) 600-400 MG-UNIT per tablet Take 1 tablet by mouth daily.     [provider]  cephALEXin  (KEFLEX ) 500 MG capsule Take 1 capsule (500 mg total) by mouth 3 (three) times daily. 02/27/16   Gershon Donnice SAUNDERS, DPM  ciclopirox (PENLAC) 8 % solution Apply topically at bedtime. Apply over nail and surrounding skin. Apply daily over previous coat. After seven (7) days, may remove with alcohol and continue cycle.    [provider]  divalproex  (DEPAKOTE ) 125 MG DR tablet Take 250-375 mg by mouth 2 (two) times daily. Take 2 tablets (250 mg) in the am & Take 3 tablets (375 mg) in the pm.    [provider]  folic acid  (FOLVITE ) 1 MG tablet Take 1 mg by mouth daily.    [provider]  furosemide  (LASIX ) 20 MG tablet Take 20 mg by mouth daily.    [provider]  guaiFENesin (MUCINEX) 600 MG 12 hr tablet Take 600 mg by mouth 2 (two) times daily as needed for cough or to loosen phlegm.    [provider]  levofloxacin  (LEVAQUIN ) 500 MG tablet Take 1 tablet (500 mg total) by mouth daily. 10/12/22   Armenta Canning, MD  levofloxacin  (LEVAQUIN ) 500 MG tablet Take 1 tablet (500 mg total) by mouth daily.  10/12/22   Emelia Sluder, PA-C  nystatin  (MYCOSTATIN ) 100000 UNIT/ML suspension Take 5 mLs (500,000 Units total) by mouth 4 (four) times daily. 10/16/15   Diedra Rocky PARAS, MD  oseltamivir  (TAMIFLU ) 75 MG capsule Take 1 capsule (75 mg total) by mouth 2 (two) times daily. 09/02/15   Rice, Lonni ORN, MD  Oxcarbazepine  (TRILEPTAL ) 300 MG tablet Take 300 mg by mouth 2 (two) times daily.    [provider]  oxybutynin  (DITROPAN ) 5 MG tablet Take 10 mg by mouth 3 (three) times daily. At 8 am, 2 pm & 8 pm.    [provider]  pantoprazole  (PROTONIX ) 40 MG tablet Take 40 mg by mouth daily.    [provider]  polyethylene  glycol powder (MIRALAX ) powder Take 17 g by mouth daily.    [provider]  pravastatin  (PRAVACHOL ) 20 MG tablet Take 20 mg by mouth daily. 5 PM    [provider]  predniSONE  (DELTASONE ) 5 MG tablet Take 5 mg by mouth daily with breakfast.    [provider]  risperiDONE  (RISPERDAL ) 0.5 MG tablet Take 0.5 mg by mouth daily. At 4 pm.    [provider]  risperiDONE  (RISPERDAL ) 1 MG tablet Take 1 mg by mouth 2 (two) times daily. At 8 am & 8 pm.    [provider]  rivaroxaban  (XARELTO ) 20 MG TABS tablet Take 20 mg by mouth daily with supper.    [provider]  tamsulosin  (FLOMAX ) 0.4 MG CAPS capsule Take 0.4 mg by mouth at bedtime.    [provider]  terbinafine (LAMISIL) 250 MG tablet Take 250 mg by mouth daily.    [provider]    Physical Exam: Constitutional: Moderately built and nourished. Vitals:   03/02/24 1946  BP: (!) 154/125  Pulse: 68  Resp: 17  Temp: (!) 97.3 F (36.3 C)  TempSrc: Axillary  SpO2: 99%   Eyes: Anicteric no pallor. ENMT: No discharge from the ears eyes nose or mouth. Neck: No mass felt.  No neck rigidity Respiratory: No rhonchi or crepitations. Cardiovascular: S1-S2 heard. Abdomen: Soft nontender bowel sound present. Musculoskeletal: No edema. Skin: No rash. Neurologic: Alert awake is able to respond to his name is moving all extremities but on trying to ambulate he is limping on the left side.  No facial asymmetry. Psychiatric: Oriented to his name.   Labs on Admission: I have personally reviewed following labs and imaging studies  CBC: Recent Labs  Lab 03/02/24 2128  WBC 7.9  NEUTROABS 3.9  HGB 14.5  HCT 44.7  MCV 91.2  PLT 152   Basic Metabolic Panel: Recent Labs  Lab 03/02/24 2128  NA 139  K 4.2  CL 101  CO2 25  GLUCOSE 109*  BUN 23  CREATININE 1.11  CALCIUM  10.1   GFR: Estimated Creatinine Clearance: 47 mL/min (by C-G formula based on SCr of 1.11  mg/dL). Liver Function Tests: Recent Labs  Lab 03/02/24 2128  AST 19  ALT 5  ALKPHOS 64  BILITOT 0.3  PROT 6.7  ALBUMIN 4.1   No results for input(s): LIPASE, AMYLASE in the last 168 hours. No results for input(s): AMMONIA in the last 168 hours. Coagulation Profile: No results for input(s): INR, PROTIME in the last 168 hours. Cardiac Enzymes: No results for input(s): CKTOTAL, CKMB, CKMBINDEX, TROPONINI in the last 168 hours. BNP (last 3 results) No results for input(s): PROBNP in the last 8760 hours. HbA1C: No results for input(s): HGBA1C in the  last 72 hours. CBG: No results for input(s): GLUCAP in the last 168 hours. Lipid Profile: No results for input(s): CHOL, HDL, LDLCALC, TRIG, CHOLHDL, LDLDIRECT in the last 72 hours. Thyroid Function Tests: No results for input(s): TSH, T4TOTAL, FREET4, T3FREE, THYROIDAB in the last 72 hours. Anemia Panel: No results for input(s): VITAMINB12, FOLATE, FERRITIN, TIBC, IRON, RETICCTPCT in the last 72 hours. Urine analysis:    Component Value Date/Time   COLORURINE YELLOW 10/12/2022 1125   APPEARANCEUR CLEAR 10/12/2022 1125   LABSPEC 1.011 10/12/2022 1125   PHURINE 5.0 10/12/2022 1125   GLUCOSEU NEGATIVE 10/12/2022 1125   HGBUR NEGATIVE 10/12/2022 1125   BILIRUBINUR NEGATIVE 10/12/2022 1125   KETONESUR NEGATIVE 10/12/2022 1125   PROTEINUR NEGATIVE 10/12/2022 1125   UROBILINOGEN 1.0 03/03/2015 1853   NITRITE NEGATIVE 10/12/2022 1125   LEUKOCYTESUR NEGATIVE 10/12/2022 1125   Sepsis Labs: @LABRCNTIP (procalcitonin:4,lacticidven:4) )No results found for this or any previous visit (from the past 240 hours).   Radiological Exams on Admission: CT Head Wo Contrast Result Date: 03/02/2024 CLINICAL DATA:  Head trauma, moderate-severe; Neck trauma (Age >= 65y) EXAM: CT HEAD WITHOUT CONTRAST CT CERVICAL SPINE WITHOUT CONTRAST TECHNIQUE: Multidetector CT imaging of the head and  cervical spine was performed following the standard protocol without intravenous contrast. Multiplanar CT image reconstructions of the cervical spine were also generated. RADIATION DOSE REDUCTION: This exam was performed according to the departmental dose-optimization program which includes automated exposure control, adjustment of the mA and/or kV according to patient size and/or use of iterative reconstruction technique. COMPARISON:  None Available. FINDINGS: CT HEAD FINDINGS Brain: No evidence of acute infarction, hemorrhage, hydrocephalus, extra-axial collection or mass lesion/mass effect. Vascular: No hyperdense vessel. Skull: No acute fracture. Sinuses/Orbits: Clear sinuses.  No acute orbital findings. Other: No mastoid effusions. CT CERVICAL SPINE FINDINGS Alignment: Straightening.  No substantial sagittal subluxation. Skull base and vertebrae: No acute fracture. No primary bone lesion or focal pathologic process. Soft tissues and spinal canal: No prevertebral fluid or swelling. No visible canal hematoma. Disc levels: Moderate to severe multilevel degenerative change, greatest at C5-C6 where there is endplate spurring that results in bilateral foraminal narrowing. Upper chest: Lung apices are clear. IMPRESSION: 1. No evidence of acute intracranial abnormality. 2. No evidence of acute fracture or traumatic malalignment in the cervical spine. Electronically Signed   By: Gilmore GORMAN Molt M.D.   On: 03/02/2024 21:19   CT Cervical Spine Wo Contrast Result Date: 03/02/2024 CLINICAL DATA:  Head trauma, moderate-severe; Neck trauma (Age >= 65y) EXAM: CT HEAD WITHOUT CONTRAST CT CERVICAL SPINE WITHOUT CONTRAST TECHNIQUE: Multidetector CT imaging of the head and cervical spine was performed following the standard protocol without intravenous contrast. Multiplanar CT image reconstructions of the cervical spine were also generated. RADIATION DOSE REDUCTION: This exam was performed according to the departmental  dose-optimization program which includes automated exposure control, adjustment of the mA and/or kV according to patient size and/or use of iterative reconstruction technique. COMPARISON:  None Available. FINDINGS: CT HEAD FINDINGS Brain: No evidence of acute infarction, hemorrhage, hydrocephalus, extra-axial collection or mass lesion/mass effect. Vascular: No hyperdense vessel. Skull: No acute fracture. Sinuses/Orbits: Clear sinuses.  No acute orbital findings. Other: No mastoid effusions. CT CERVICAL SPINE FINDINGS Alignment: Straightening.  No substantial sagittal subluxation. Skull base and vertebrae: No acute fracture. No primary bone lesion or focal pathologic process. Soft tissues and spinal canal: No prevertebral fluid or swelling. No visible canal hematoma. Disc levels: Moderate to severe multilevel degenerative change, greatest at C5-C6 where there is  endplate spurring that results in bilateral foraminal narrowing. Upper chest: Lung apices are clear. IMPRESSION: 1. No evidence of acute intracranial abnormality. 2. No evidence of acute fracture or traumatic malalignment in the cervical spine. Electronically Signed   By: Gilmore GORMAN Molt M.D.   On: 03/02/2024 21:19   DG Knee Complete 4 Views Left Result Date: 03/02/2024 CLINICAL DATA:  Recent fall with left knee pain, initial encounter EXAM: LEFT KNEE - COMPLETE 4+ VIEW COMPARISON:  None Available. FINDINGS: Tricompartmental degenerative changes are noted. No acute fracture or dislocation is seen. Vascular calcifications are noted. No joint effusion is noted. IMPRESSION: Degenerative change without acute abnormality. Electronically Signed   By: Oneil Devonshire M.D.   On: 03/02/2024 21:06   DG Pelvis Portable Result Date: 03/01/2024 CLINICAL DATA:  Fall. EXAM: PORTABLE PELVIS 1-2 VIEWS COMPARISON:  Right hip radiograph dated 08/09/2017. 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 The arthroplasty components appear intact. No acute fracture or  dislocation. The bones are osteopenic. Heterotopic ossification adjacent to the proximal right femur. The soft tissues are unremarkable. IMPRESSION: 1. No acute fracture or dislocation. 2. Right hip arthroplasty. Electronically Signed   By: Vanetta Chou M.D.   On: 03/01/2024 18:39    Assessment/Plan Principal Problem:   Left-sided weakness    Left-sided weakness -    x-rays did not show any fracture.  Discussed with Dr. Merrianne on-call neurologist who recommended getting MRI brain and if it shows stroke then further workup. History of rheumatoid arthritis on prednisone . History of bipolar disorder and history of seizures on Trileptal  and Depakote  Risperdal . History of hypertension on Lasix . Hyperlipidemia on statins. DVT on Xarelto . History of autism and intellectual disability. BPH and urinary incontinence on Flomax  finasteride  and oxybutynin .  All medications were confirmed with patient's caregiver.   Since patient has possible stroke will need further workup and more than 2 midnight stay.   DVT prophylaxis: Xarelto . Code Status: Full code. Family Communication: Discussed with patient's caregiver.  Patient has a state guardian with whom we need to discuss in the morning. Disposition Plan: Monitored bed. Consults called: Discussed with neurologist. Admission status: Observation but

## 2024-03-03 ENCOUNTER — Observation Stay (HOSPITAL_COMMUNITY)

## 2024-03-03 ENCOUNTER — Other Ambulatory Visit: Payer: Self-pay

## 2024-03-03 DIAGNOSIS — Z86718 Personal history of other venous thrombosis and embolism: Secondary | ICD-10-CM | POA: Diagnosis not present

## 2024-03-03 DIAGNOSIS — G8192 Hemiplegia, unspecified affecting left dominant side: Secondary | ICD-10-CM | POA: Diagnosis not present

## 2024-03-03 DIAGNOSIS — R32 Unspecified urinary incontinence: Secondary | ICD-10-CM

## 2024-03-03 DIAGNOSIS — M069 Rheumatoid arthritis, unspecified: Secondary | ICD-10-CM | POA: Diagnosis not present

## 2024-03-03 DIAGNOSIS — R531 Weakness: Secondary | ICD-10-CM | POA: Diagnosis not present

## 2024-03-03 DIAGNOSIS — F840 Autistic disorder: Secondary | ICD-10-CM | POA: Diagnosis not present

## 2024-03-03 LAB — CBC
HCT: 41.8 % (ref 39.0–52.0)
Hemoglobin: 13.8 g/dL (ref 13.0–17.0)
MCH: 30.1 pg (ref 26.0–34.0)
MCHC: 33 g/dL (ref 30.0–36.0)
MCV: 91.3 fL (ref 80.0–100.0)
Platelets: 147 K/uL — ABNORMAL LOW (ref 150–400)
RBC: 4.58 MIL/uL (ref 4.22–5.81)
RDW: 14.7 % (ref 11.5–15.5)
WBC: 8.9 K/uL (ref 4.0–10.5)
nRBC: 0.2 % (ref 0.0–0.2)

## 2024-03-03 LAB — COMPREHENSIVE METABOLIC PANEL WITH GFR
ALT: 8 U/L (ref 0–44)
AST: 22 U/L (ref 15–41)
Albumin: 3.8 g/dL (ref 3.5–5.0)
Alkaline Phosphatase: 59 U/L (ref 38–126)
Anion gap: 12 (ref 5–15)
BUN: 21 mg/dL (ref 8–23)
CO2: 26 mmol/L (ref 22–32)
Calcium: 9.6 mg/dL (ref 8.9–10.3)
Chloride: 101 mmol/L (ref 98–111)
Creatinine, Ser: 0.91 mg/dL (ref 0.61–1.24)
GFR, Estimated: >60 mL/min (ref >60)
Glucose, Bld: 89 mg/dL (ref 70–99)
Potassium: 4.4 mmol/L (ref 3.5–5.1)
Sodium: 138 mmol/L (ref 135–145)
Total Bilirubin: 0.3 mg/dL (ref 0.0–1.2)
Total Protein: 6.2 g/dL — ABNORMAL LOW (ref 6.5–8.1)

## 2024-03-03 LAB — MRSA NEXT GEN BY PCR, NASAL: MRSA by PCR Next Gen: NOT DETECTED

## 2024-03-03 LAB — VALPROIC ACID LEVEL: Valproic Acid Lvl: 78 ug/mL (ref 50–100)

## 2024-03-03 MED ORDER — RIVAROXABAN 20 MG PO TABS
20.0000 mg | ORAL_TABLET | Freq: Every day | ORAL | Status: DC
  Administered 2024-03-03 – 2024-03-05 (×3): 20 mg via ORAL
  Filled 2024-03-03 (×3): qty 1

## 2024-03-03 MED ORDER — OXCARBAZEPINE 300 MG PO TABS
450.0000 mg | ORAL_TABLET | Freq: Two times a day (BID) | ORAL | Status: DC
  Administered 2024-03-03 – 2024-03-05 (×6): 450 mg via ORAL
  Filled 2024-03-03 (×6): qty 1

## 2024-03-03 MED ORDER — FUROSEMIDE 40 MG PO TABS
40.0000 mg | ORAL_TABLET | Freq: Every day | ORAL | Status: DC
  Administered 2024-03-03 – 2024-03-05 (×3): 40 mg via ORAL
  Filled 2024-03-03 (×3): qty 1

## 2024-03-03 MED ORDER — FOLIC ACID 1 MG PO TABS
1.0000 mg | ORAL_TABLET | Freq: Every day | ORAL | Status: DC
  Administered 2024-03-03 – 2024-03-05 (×3): 1 mg via ORAL
  Filled 2024-03-03 (×3): qty 1

## 2024-03-03 MED ORDER — TAMSULOSIN HCL 0.4 MG PO CAPS
0.4000 mg | ORAL_CAPSULE | Freq: Every day | ORAL | Status: DC
  Administered 2024-03-03 – 2024-03-04 (×2): 0.4 mg via ORAL
  Filled 2024-03-03 (×2): qty 1

## 2024-03-03 MED ORDER — OXYBUTYNIN CHLORIDE 5 MG PO TABS
5.0000 mg | ORAL_TABLET | Freq: Every day | ORAL | Status: DC
  Administered 2024-03-03 – 2024-03-04 (×3): 5 mg via ORAL
  Filled 2024-03-03 (×3): qty 1

## 2024-03-03 MED ORDER — RISPERIDONE 1 MG PO TABS
5.0000 mg | ORAL_TABLET | Freq: Every day | ORAL | Status: DC
  Administered 2024-03-03 – 2024-03-04 (×3): 5 mg via ORAL
  Filled 2024-03-03: qty 5
  Filled 2024-03-03: qty 10
  Filled 2024-03-03: qty 5

## 2024-03-03 MED ORDER — LORAZEPAM 2 MG/ML IJ SOLN
1.0000 mg | Freq: Once | INTRAMUSCULAR | Status: AC | PRN
  Administered 2024-03-03: 1 mg via INTRAVENOUS
  Filled 2024-03-03: qty 1

## 2024-03-03 MED ORDER — DIVALPROEX SODIUM 250 MG PO DR TAB
250.0000 mg | DELAYED_RELEASE_TABLET | Freq: Every day | ORAL | Status: DC
  Administered 2024-03-03 – 2024-03-05 (×3): 250 mg via ORAL
  Filled 2024-03-03: qty 1
  Filled 2024-03-03: qty 2
  Filled 2024-03-03: qty 1

## 2024-03-03 MED ORDER — RISPERIDONE 1 MG PO TABS
1.0000 mg | ORAL_TABLET | Freq: Two times a day (BID) | ORAL | Status: DC
  Administered 2024-03-03 – 2024-03-05 (×5): 1 mg via ORAL
  Filled 2024-03-03: qty 2
  Filled 2024-03-03 (×2): qty 1
  Filled 2024-03-03: qty 2
  Filled 2024-03-03: qty 1

## 2024-03-03 MED ORDER — DIVALPROEX SODIUM 250 MG PO DR TAB
375.0000 mg | DELAYED_RELEASE_TABLET | Freq: Every day | ORAL | Status: DC
  Administered 2024-03-03 – 2024-03-04 (×3): 375 mg via ORAL
  Filled 2024-03-03 (×3): qty 1

## 2024-03-03 MED ORDER — POTASSIUM CHLORIDE CRYS ER 10 MEQ PO TBCR
10.0000 meq | EXTENDED_RELEASE_TABLET | Freq: Every day | ORAL | Status: DC
  Administered 2024-03-03 – 2024-03-05 (×3): 10 meq via ORAL
  Filled 2024-03-03 (×3): qty 1

## 2024-03-03 MED ORDER — PREDNISONE 5 MG PO TABS
5.0000 mg | ORAL_TABLET | Freq: Every day | ORAL | Status: DC
  Administered 2024-03-03 – 2024-03-05 (×3): 5 mg via ORAL
  Filled 2024-03-03 (×3): qty 1

## 2024-03-03 MED ORDER — FINASTERIDE 5 MG PO TABS
5.0000 mg | ORAL_TABLET | Freq: Every day | ORAL | Status: DC
  Administered 2024-03-03 – 2024-03-05 (×3): 5 mg via ORAL
  Filled 2024-03-03 (×3): qty 1

## 2024-03-03 MED ORDER — ROSUVASTATIN CALCIUM 10 MG PO TABS
10.0000 mg | ORAL_TABLET | Freq: Every day | ORAL | Status: DC
  Administered 2024-03-03 – 2024-03-05 (×3): 10 mg via ORAL
  Filled 2024-03-03 (×3): qty 1

## 2024-03-03 NOTE — Progress Notes (Addendum)
   03/03/24 1259  PT Visit Information  Last PT Received On 03/03/24  Reason Eval/Treat Not Completed Fatigue/lethargy limiting ability to participate  History of Present Illness Alan Rhodes is an 83 year old male in ED at [facility] s/p unwitnessed fall yesterday.  Per caregiver patient was found down on the ground yesterday morning.  He was evaluated here yesterday and had normal x-ray of left hip and pelvis, able to ambulate but appears to be falling to R side and dragging L LE req assist to amb. Awaiting MRI. EFY:[id number] disability/autism, rheumatoid arthritis, seizure disorder, bipolar disorder, hyperlipidemia, history of DVT   Pt sleeping, was able to briefly open eyes but then returned to sleep. Difficult to awaken. MRI results pending and will follow up/re attempt when more awake and participative.   Stann, PT Acute Rehabilitation Services Office: [phone] 03/03/2024

## 2024-03-03 NOTE — Progress Notes (Signed)
 PROGRESS NOTE    Alan Rhodes  FMW:[id number] DOB: 08-22-1940 DOA: 03/02/2024 PCP: Pia Kerney SQUIBB, MD   Brief Narrative:  Alan Rhodes is a 83 y.o. male with history of intellectual disability/autism, rheumatoid arthritis, seizure disorder, bipolar disorder, hyperlipidemia, history of DVT was brought to the ER after patient was found to be weak on the left side and limping.  Per patient's caregiver who was at the bedside patient had a fall yesterday and was brought to the ER x-rays did not show any fracture of the left knee or pelvis.  This morning the caregiver took patient to daycare and at around evening when she went back to pick him up, the daycare person told the caregiver that patient has not been walking the whole day and has been limping when he tries to.  He was brought to the ER.  Patient is noncommunicative.  Does not provide any history.   ED Course: In the ER on ambulating patient is limping on the left side with unsteadiness.  But does not seem to have any pain.  CT head and C-spine were negative.  Labs are largely unremarkable.  ER physician discussed with Dr. Merrianne on-call neurologist who recommended getting MRI brain and if patient is positive for stroke then further workup.  **Interim History MRI brain done and was negative for CVA.  Awaiting PT OT evaluation  Assessment and Plan:  Left-Sided Leg Weakness:  Had unwitnessed fall and X-rays did not show any fracture.  Dr. Franky with Dr. Merrianne on-call neurologist who recommended getting MRI brain and it showed No acute intracranial abnormality. Age-related cerebral and cerebellar volume loss and moderate cerebral white matter disease. CTM and will obtain PT/OT to Evaluate  History of Rheumatoid Arthritis: C/w Prednisone  5 mg po Daily   History of Bipolar Disorder and History of Seizures: C/w Divalproex  375 mg po at bedtime and 250 mg po Daily, Oxcarbazepine  450 mg  History of Hypertension: C/w Furosemide  40 mg  po Daily. CTM BP per Protocol. Last BP reading was 135/57  Hyperlipidemia: C/w Rosuvastatin  10 mg po Daily   DVT: C/w Rivaroxaban  20 mg po Daily   Thrombocytopenia: Plt Count is now 147. CTM monitor and Trend. Repeat CBC in the AM  History of Autism and Intellectual Disability: C/w Supportive Care; Non-verbal at baseline   BPH and Urinary Incontinence: C/w Oxybutynin  5 mg po at bedtime, Tamsulosin  0.4 mg po at bedtime, and Finasteride  5 mg po Daily      DVT prophylaxis:  rivaroxaban  (XARELTO ) tablet 20 mg    Code Status: Full Code Family Communication: Discussed with caregiver at bedside  Disposition Plan:  Level of care: Telemetry Status is: Observation The patient remains OBS appropriate and will d/c before 2 midnights.   Consultants:  Admitting Physician spoke with Neurology   Procedures:  As delineated as above  Antimicrobials:  Anti-infectives (From admission, onward)    None       Subjective: Seen and examined and patient is nonverbal.  Appears to have no complaints.  Caregiver at bedside states that he is doing okay.  No other concerns or complaints at this time.  Objective: Vitals:   03/03/24 1125 03/03/24 1518 03/03/24 1551 03/03/24 1942  BP: (!) 134/91 (!) 150/72 129/71 (!) 135/57  Pulse: (!) 58 (!) 53 (!) 54 60  Resp: 17 16 17 16   Temp: (!) 97.5 F (36.4 C) 97.7 F (36.5 C) (!) 97.5 F (36.4 C) (!) 97.5 F (36.4 C)  TempSrc: Axillary  Axillary Oral Oral  SpO2: 95% 98% 98% 98%  Weight:   55.3 kg   Height:   5' 3 (1.6 m)     Intake/Output Summary (Last 24 hours) at 03/03/2024 2007 Last data filed at 03/03/2024 1851 Gross per 24 hour  Intake --  Output 100 ml  Net -100 ml   Filed Weights   03/03/24 1551  Weight: 55.3 kg   Examination: Physical Exam:  Constitutional: Chronically ill-appearing Caucasian male with Autism and Intellectual disability in NAD Respiratory: Diminished to auscultation bilaterally, no wheezing, rales, rhonchi or  crackles. Normal respiratory effort and patient is not tachypenic. No accessory muscle use. Unlabored breathing  Cardiovascular: RRR, no murmurs / rubs / gallops. S1 and S2 auscultated. No extremity edema.  Abdomen: Soft, non-tender, non-distended. Bowel sounds positive.  GU: Deferred. Musculoskeletal: No clubbing / cyanosis of digits/nails. No joint deformity upper and lower extremities.  Skin: No rashes, lesions, ulcers on a limited skin evaluation. No induration; Warm and dry.  Neurologic: Non-verbal and tracks with his eyes  Psychiatric: Impaired judgment and insight.    Data Reviewed: I have personally reviewed following labs and imaging studies  CBC: Recent Labs  Lab 03/02/24 2128 03/03/24 0523  WBC 7.9 8.9  NEUTROABS 3.9  --   HGB 14.5 13.8  HCT 44.7 41.8  MCV 91.2 91.3  PLT 152 147*   Basic Metabolic Panel: Recent Labs  Lab 03/02/24 2128 03/03/24 0523  NA 139 138  K 4.2 4.4  CL 101 101  CO2 25 26  GLUCOSE 109* 89  BUN 23 21  CREATININE 1.11 0.91  CALCIUM  10.1 9.6   GFR: Estimated Creatinine Clearance: 49.8 mL/min (by C-G formula based on SCr of 0.91 mg/dL). Liver Function Tests: Recent Labs  Lab 03/02/24 2128 03/03/24 0523  AST 19 22  ALT 5 8  ALKPHOS 64 59  BILITOT 0.3 0.3  PROT 6.7 6.2*  ALBUMIN 4.1 3.8   No results for input(s): LIPASE, AMYLASE in the last 168 hours. No results for input(s): AMMONIA in the last 168 hours. Coagulation Profile: No results for input(s): INR, PROTIME in the last 168 hours. Cardiac Enzymes: No results for input(s): CKTOTAL, CKMB, CKMBINDEX, TROPONINI in the last 168 hours. BNP (last 3 results) No results for input(s): PROBNP in the last 8760 hours. HbA1C: No results for input(s): HGBA1C in the last 72 hours. CBG: No results for input(s): GLUCAP in the last 168 hours. Lipid Profile: No results for input(s): CHOL, HDL, LDLCALC, TRIG, CHOLHDL, LDLDIRECT in the last 72  hours. Thyroid Function Tests: No results for input(s): TSH, T4TOTAL, FREET4, T3FREE, THYROIDAB in the last 72 hours. Anemia Panel: No results for input(s): VITAMINB12, FOLATE, FERRITIN, TIBC, IRON, RETICCTPCT in the last 72 hours. Sepsis Labs: No results for input(s): PROCALCITON, LATICACIDVEN in the last 168 hours.  Recent Results (from the past 240 hours)  MRSA Next Gen by PCR, Nasal     Status: None   Collection Time: 03/03/24  6:13 PM   Specimen: Nasal Mucosa; Nasal Swab  Result Value Ref Range Status   MRSA by PCR Next Gen NOT DETECTED NOT DETECTED Final    Comment: (NOTE) The GeneXpert MRSA Assay (FDA approved for NASAL specimens only), is one component of a comprehensive MRSA colonization surveillance program. It is not intended to diagnose MRSA infection nor to guide or monitor treatment for MRSA infections. Test performance is not FDA approved in patients less than 47 years old. Performed at [facility], [address], [place], [place] [address]     Radiology Studies: MR BRAIN WO CONTRAST Result Date: 03/03/2024 EXAM: MRI BRAIN WITHOUT CONTRAST 03/03/2024 12:09:58 PM TECHNIQUE: Multiplanar multisequence MRI of the head/brain was performed without the administration of intravenous contrast. COMPARISON: CT of the head dated 03/02/2024. CLINICAL HISTORY: Neuro deficit, acute, stroke suspected. Altered mental status. FINDINGS: BRAIN AND VENTRICLES: No acute infarct. No intracranial hemorrhage. No mass. No midline shift. No hydrocephalus. The sella is unremarkable. Normal flow voids. Age-related cerebral and cerebellar volume loss. Moderate cerebral white matter disease. ORBITS: No acute abnormality. SINUSES AND MASTOIDS: No acute abnormality. BONES AND SOFT TISSUES: Normal marrow signal. No acute soft tissue abnormality. IMPRESSION: 1. No acute intracranial abnormality. 2. Age-related cerebral and cerebellar volume loss and moderate  cerebral white matter disease. Electronically signed by: Evalene Coho MD 03/03/2024 12:17 PM EDT RP Workstation: [id number]   CT Head Wo Contrast Result Date: 03/02/2024 CLINICAL DATA:  Head trauma, moderate-severe; Neck trauma (Age >= 65y) EXAM: CT HEAD WITHOUT CONTRAST CT CERVICAL SPINE WITHOUT CONTRAST TECHNIQUE: Multidetector CT imaging of the head and cervical spine was performed following the standard protocol without intravenous contrast. Multiplanar CT image reconstructions of the cervical spine were also generated. RADIATION DOSE REDUCTION: This exam was performed according to the departmental dose-optimization program which includes automated exposure control, adjustment of the mA and/or kV according to patient size and/or use of iterative reconstruction technique. COMPARISON:  None Available. FINDINGS: CT HEAD FINDINGS Brain: No evidence of acute infarction, hemorrhage, hydrocephalus, extra-axial collection or mass lesion/mass effect. Vascular: No hyperdense vessel. Skull: No acute fracture. Sinuses/Orbits: Clear sinuses.  No acute orbital findings. Other: No mastoid effusions. CT CERVICAL SPINE FINDINGS Alignment: Straightening.  No substantial sagittal subluxation. Skull base and vertebrae: No acute fracture. No primary bone lesion or focal pathologic process. Soft tissues and spinal canal: No prevertebral fluid or swelling. No visible canal hematoma. Disc levels: Moderate to severe multilevel degenerative change, greatest at C5-C6 where there is endplate spurring that results in bilateral foraminal narrowing. Upper chest: Lung apices are clear. IMPRESSION: 1. No evidence of acute intracranial abnormality. 2. No evidence of acute fracture or traumatic malalignment in the cervical spine. Electronically Signed   By: Gilmore GORMAN Molt M.D.   On: 03/02/2024 21:19   CT Cervical Spine Wo Contrast Result Date: 03/02/2024 CLINICAL DATA:  Head trauma, moderate-severe; Neck trauma (Age >= 65y) EXAM: CT  HEAD WITHOUT CONTRAST CT CERVICAL SPINE WITHOUT CONTRAST TECHNIQUE: Multidetector CT imaging of the head and cervical spine was performed following the standard protocol without intravenous contrast. Multiplanar CT image reconstructions of the cervical spine were also generated. RADIATION DOSE REDUCTION: This exam was performed according to the departmental dose-optimization program which includes automated exposure control, adjustment of the mA and/or kV according to patient size and/or use of iterative reconstruction technique. COMPARISON:  None Available. FINDINGS: CT HEAD FINDINGS Brain: No evidence of acute infarction, hemorrhage, hydrocephalus, extra-axial collection or mass lesion/mass effect. Vascular: No hyperdense vessel. Skull: No acute fracture. Sinuses/Orbits: Clear sinuses.  No acute orbital findings. Other: No mastoid effusions. CT CERVICAL SPINE FINDINGS Alignment: Straightening.  No substantial sagittal subluxation. Skull base and vertebrae: No acute fracture. No primary bone lesion or focal pathologic process. Soft tissues and spinal canal: No prevertebral fluid or swelling. No visible canal hematoma. Disc levels: Moderate to severe multilevel degenerative change, greatest at C5-C6 where there is endplate spurring that results in bilateral foraminal narrowing. Upper chest: Lung apices are clear. IMPRESSION: 1. No evidence of acute intracranial  abnormality. 2. No evidence of acute fracture or traumatic malalignment in the cervical spine. Electronically Signed   By: Gilmore GORMAN Molt M.D.   On: 03/02/2024 21:19   DG Knee Complete 4 Views Left Result Date: 03/02/2024 CLINICAL DATA:  Recent fall with left knee pain, initial encounter EXAM: LEFT KNEE - COMPLETE 4+ VIEW COMPARISON:  None Available. FINDINGS: Tricompartmental degenerative changes are noted. No acute fracture or dislocation is seen. Vascular calcifications are noted. No joint effusion is noted. IMPRESSION: Degenerative change without  acute abnormality. Electronically Signed   By: Oneil Devonshire M.D.   On: 03/02/2024 21:06   Scheduled Meds:   stroke: early stages of recovery book   Does not apply Once   divalproex   250 mg Oral Daily   divalproex   375 mg Oral QHS   finasteride   5 mg Oral Daily   folic acid   1 mg Oral Daily   furosemide   40 mg Oral Daily   OXcarbazepine   450 mg Oral BID   oxybutynin   5 mg Oral QHS   potassium chloride   10 mEq Oral Daily   predniSONE   5 mg Oral Q breakfast   risperiDONE   1 mg Oral BID WC   risperiDONE   5 mg Oral QHS   rivaroxaban   20 mg Oral Daily   rosuvastatin   10 mg Oral Daily   tamsulosin   0.4 mg Oral QPC supper   Continuous Infusions:   LOS: 0 days   Alejandro Marker, DO [facility] Available via Epic secure chat 7am-7pm After these hours, please refer to coverage provider listed on [email or web address] 03/03/2024, 8:07 PM

## 2024-03-03 NOTE — ED Notes (Signed)
 Paramedic Sharlett FALCON and NT Consuelo attempted to In and Out Cath patient without success.

## 2024-03-03 NOTE — Hospital Course (Signed)
 Alan Rhodes is a 83 y.o. male with history of intellectual disability/autism, rheumatoid arthritis, seizure disorder, bipolar disorder, hyperlipidemia, history of DVT was brought to the ER after patient was found to be weak on the left side and limping.  Per patient's caregiver who was at the bedside patient had a fall yesterday and was brought to the ER x-rays did not show any fracture of the left knee or pelvis.  This morning the caregiver took patient to daycare and at around evening when she went back to pick him up, the daycare person told the caregiver that patient has not been walking the whole day and has been limping when he tries to.  He was brought to the ER.  Patient is noncommunicative.  Does not provide any history.   ED Course: In the ER on ambulating patient is limping on the left side with unsteadiness.  But does not seem to have any pain.  CT head and C-spine were negative.  Labs are largely unremarkable.  ER physician discussed with Dr. Merrianne on-call neurologist who recommended getting MRI brain and if patient is positive for stroke then further workup.  **Interim History MRI brain done and was negative for CVA.  Patient was a little somnolent and drowsy yesterday and PT OT worked with him and caregiver did not think he is just yet at baseline.  He was monitored overnight and had been evaluated by physical therapy and he did well.  He is much improved and medically stable for discharge only to follow-up with PCP within 1 to 2 weeks.  Assessment and Plan:  Left-Sided Leg Weakness:  Had unwitnessed fall and X-rays did not show any fracture.  Dr. Franky with Dr. Merrianne on-call neurologist who recommended getting MRI brain and it showed No acute intracranial abnormality. Age-related cerebral and cerebellar volume loss and moderate cerebral white matter disease. CTM and will obtain PT/OT to Evaluate and they worked with him and recommending home health with rolling walker.  He is  improved and ambulated without issues today and is medically stable for discharge and will need to continue with therapy efforts  History of Rheumatoid Arthritis: C/w Prednisone  5 mg po Daily   History of Bipolar Disorder and History of Seizures: C/w Divalproex  375 mg po at bedtime and 250 mg po Daily, Oxcarbazepine  450 mg  History of Hypertension: C/w Furosemide  40 mg po Daily. CTM BP per Protocol. Last BP reading was 120/70  Hypophosphatemia: Phos levels 2.1 on last check.  Replete with p.o. K-Phos Neutral 500 mg p.o. twice daily x 2 yesterday.  Continue to monitor and replete as necessary.  Repeat Phos level within 1 week  Hyperlipidemia: C/w Rosuvastatin  10 mg po Daily   DVT: C/w Rivaroxaban  20 mg po Daily   Thrombocytopenia: Plt Count is now 145 on last check. CTM monitor and Trend. Repeat CBC in the AM  History of Autism and Intellectual Disability: C/w Supportive Care; Non-verbal at baseline   BPH and Urinary Incontinence: C/w Oxybutynin  5 mg po at bedtime, Tamsulosin  0.4 mg po at bedtime, and Finasteride  5 mg po Daily

## 2024-03-03 NOTE — ED Notes (Addendum)
 [facility] stroke scale may not be accurate of pt's ability do to inability to perform some assessments. Pt has incomprehensible speech at baseline. Pt able to follow some simple commands but struggle with understanding complicated commands.

## 2024-03-03 NOTE — Progress Notes (Signed)
 OT Cancellation Note  Patient Details Name: Alan Rhodes MRN: [id number] DOB: 05-28-1941   Cancelled Treatment:    Reason Eval/Treat Not Completed: Fatigue/lethargy limiting ability to participate  Patient asleep and unable to arouse fully for therapy evals attempted. Will check back and re-attempt when patient able to participate and as schedule allows.   Christain Mcraney OT/L Acute Rehabilitation Department  [phone]   03/03/2024, 12:36 PM

## 2024-03-04 DIAGNOSIS — F319 Bipolar disorder, unspecified: Secondary | ICD-10-CM

## 2024-03-04 DIAGNOSIS — Z86718 Personal history of other venous thrombosis and embolism: Secondary | ICD-10-CM | POA: Diagnosis not present

## 2024-03-04 DIAGNOSIS — G8192 Hemiplegia, unspecified affecting left dominant side: Secondary | ICD-10-CM | POA: Diagnosis not present

## 2024-03-04 DIAGNOSIS — R531 Weakness: Secondary | ICD-10-CM | POA: Diagnosis not present

## 2024-03-04 DIAGNOSIS — F840 Autistic disorder: Secondary | ICD-10-CM | POA: Diagnosis not present

## 2024-03-04 LAB — COMPREHENSIVE METABOLIC PANEL WITH GFR
ALT: <5 U/L (ref 0–44)
AST: 16 U/L (ref 15–41)
Albumin: 3.6 g/dL (ref 3.5–5.0)
Alkaline Phosphatase: 57 U/L (ref 38–126)
Anion gap: 13 (ref 5–15)
BUN: 18 mg/dL (ref 8–23)
CO2: 25 mmol/L (ref 22–32)
Calcium: 9.1 mg/dL (ref 8.9–10.3)
Chloride: 100 mmol/L (ref 98–111)
Creatinine, Ser: 0.75 mg/dL (ref 0.61–1.24)
GFR, Estimated: >60 mL/min (ref >60)
Glucose, Bld: 78 mg/dL (ref 70–99)
Potassium: 4.1 mmol/L (ref 3.5–5.1)
Sodium: 137 mmol/L (ref 135–145)
Total Bilirubin: 0.4 mg/dL (ref 0.0–1.2)
Total Protein: 5.8 g/dL — ABNORMAL LOW (ref 6.5–8.1)

## 2024-03-04 LAB — CBC WITH DIFFERENTIAL/PLATELET
Abs Immature Granulocytes: 0.06 K/uL (ref 0.00–0.07)
Basophils Absolute: 0 K/uL (ref 0.0–0.1)
Basophils Relative: 0 %
Eosinophils Absolute: 0 K/uL (ref 0.0–0.5)
Eosinophils Relative: 0 %
HCT: 41.6 % (ref 39.0–52.0)
Hemoglobin: 13.7 g/dL (ref 13.0–17.0)
Immature Granulocytes: 1 %
Lymphocytes Relative: 51 %
Lymphs Abs: 4.5 K/uL — ABNORMAL HIGH (ref 0.7–4.0)
MCH: 30.4 pg (ref 26.0–34.0)
MCHC: 32.9 g/dL (ref 30.0–36.0)
MCV: 92.2 fL (ref 80.0–100.0)
Monocytes Absolute: 0.4 K/uL (ref 0.1–1.0)
Monocytes Relative: 5 %
Neutro Abs: 3.8 K/uL (ref 1.7–7.7)
Neutrophils Relative %: 43 %
Platelets: 145 K/uL — ABNORMAL LOW (ref 150–400)
RBC: 4.51 MIL/uL (ref 4.22–5.81)
RDW: 14.7 % (ref 11.5–15.5)
WBC: 8.9 K/uL (ref 4.0–10.5)
nRBC: 0 % (ref 0.0–0.2)

## 2024-03-04 LAB — MAGNESIUM: Magnesium: 2 mg/dL (ref 1.7–2.4)

## 2024-03-04 LAB — PHOSPHORUS: Phosphorus: 2.1 mg/dL — ABNORMAL LOW (ref 2.5–4.6)

## 2024-03-04 MED ORDER — K PHOS MONO-SOD PHOS DI & MONO 155-852-130 MG PO TABS
500.0000 mg | ORAL_TABLET | Freq: Two times a day (BID) | ORAL | Status: AC
  Administered 2024-03-04 (×2): 500 mg via ORAL
  Filled 2024-03-04 (×2): qty 2

## 2024-03-04 NOTE — Progress Notes (Addendum)
 PROGRESS NOTE    Alan Rhodes  FMW:[id number] DOB: [birth year] DOA: 03/02/2024 PCP: Pia Kerney SQUIBB, MD   Brief Narrative:  Alan Rhodes is a 83 y.o. male with history of intellectual disability/autism, rheumatoid arthritis, seizure disorder, bipolar disorder, hyperlipidemia, history of DVT was brought to the ER after patient was found to be weak on the left side and limping.  Per patient's caregiver who was at the bedside patient had a fall yesterday and was brought to the ER x-rays did not show any fracture of the left knee or pelvis.  This morning the caregiver took patient to daycare and at around evening when she went back to pick him up, the daycare person told the caregiver that patient has not been walking the whole day and has been limping when he tries to.  He was brought to the ER.  Patient is noncommunicative.  Does not provide any history.   ED Course: In the ER on ambulating patient is limping on the left side with unsteadiness.  But does not seem to have any pain.  CT head and C-spine were negative.  Labs are largely unremarkable.  ER physician discussed with Dr. Merrianne on-call neurologist who recommended getting MRI brain and if patient is positive for stroke then further workup.  **Interim History MRI brain done and was negative for CVA.  Patient was a little somnolent and drowsy today and PT OT worked with him and caregiver does not think he is just yet at baseline.  Will monitor him another night.  Slowly improving.  Assessment and Plan:  Left-Sided Leg Weakness:  Had unwitnessed fall and X-rays did not show any fracture.  Dr. Franky with Dr. Merrianne on-call neurologist who recommended getting MRI brain and it showed No acute intracranial abnormality. Age-related cerebral and cerebellar volume loss and moderate cerebral white matter disease. CTM and will obtain PT/OT to Evaluate and they worked with him and recommending home health with rolling walker.  He is not yet  at baseline so we will monitor him for another night and see how he does again with therapy and anticipating discharge in next 24 hours  History of Rheumatoid Arthritis: C/w Prednisone  5 mg po Daily   History of Bipolar Disorder and History of Seizures: C/w Divalproex  375 mg po at bedtime and 250 mg po Daily, Oxcarbazepine  450 mg  History of Hypertension: C/w Furosemide  40 mg po Daily. CTM BP per Protocol. Last BP reading was 150/70  Hypophosphatemia: Phos levels 2.1.  Replete with p.o. K-Phos Neutral 500 mg p.o. twice daily x 2.  Continue to monitor and replete as necessary.  Repeat Phos level in a.m.  Hyperlipidemia: C/w Rosuvastatin  10 mg po Daily   DVT: C/w Rivaroxaban  20 mg po Daily   Thrombocytopenia: Plt Count is now 147. CTM monitor and Trend. Repeat CBC in the AM  History of Autism and Intellectual Disability: C/w Supportive Care; Non-verbal at baseline   BPH and Urinary Incontinence: C/w Oxybutynin  5 mg po at bedtime, Tamsulosin  0.4 mg po at bedtime, and Finasteride  5 mg po Daily   DVT prophylaxis:  rivaroxaban  (XARELTO ) tablet 20 mg    Code Status: Full Code Family Communication: Discussed with caregiver at bedside  Disposition Plan:  Level of care: Telemetry Status is: Observation The patient remains OBS appropriate and will d/c before 2 midnights.   Consultants:  The admitting physician discussed with neurology  Procedures:  As delineated as above  Antimicrobials:  Anti-infectives (From admission, onward)  None       Subjective: Seen and examined at bedside and remains nonverbal and was somnolent drowsy little difficult to arouse.  Caregiver thinks he is doing better but still not as baseline.  PT worked with the patient and recommended watching him another night and repeating PT evaluation the morning.  Leg weakness is not as pronounced.  No other concerns or complaints at this time.  Objective: Vitals:   03/03/24 1942 03/04/24 0014 03/04/24 0413  03/04/24 1208  BP: (!) 135/57 137/85 119/74 (!) 150/70  Pulse: 60 84 61 64  Resp: 16 15 18 16   Temp: (!) 97.5 F (36.4 C) 97.8 F (36.6 C) 97.7 F (36.5 C) 97.6 F (36.4 C)  TempSrc: Oral Oral Oral Oral  SpO2: 98% 90% 95% 96%  Weight:      Height:        Intake/Output Summary (Last 24 hours) at 03/04/2024 1934 Last data filed at 03/04/2024 1903 Gross per 24 hour  Intake 290 ml  Output 700 ml  Net -410 ml   Filed Weights   03/03/24 1551  Weight: 55.3 kg   Examination: Physical Exam:  Constitutional: Chronically ill-appearing elderly Caucasian male with autism and intellectual disability in no acute distress appears somnolent and drowsy.   Respiratory: A little diminished to auscultation bilaterally with some mild rhonchi but no appreciable wheezing, rales or crackles. Normal respiratory effort and patient is not tachypenic. No accessory muscle use.  Unlabored breathing Cardiovascular: RRR, no murmurs / rubs / gallops. S1 and S2 auscultated. No extremity edema.  Abdomen: Soft, non-tender, non-distended.  Bowel sounds positive.  GU: Deferred. Musculoskeletal: No clubbing / cyanosis of digits/nails. No joint deformity upper and lower extremities.  Skin: No rashes, lesions, ulcers limited skin evaluation. No induration; Warm and dry.  Neurologic: Somnolent and drowsy and did not really want to wake up for the encounter.  Patient is nonverbal and has a history of autism and intellectual disability Psychiatric: Impaired judgment and insight  Data Reviewed: I have personally reviewed following labs and imaging studies  CBC: Recent Labs  Lab 03/02/24 2128 03/03/24 0523 03/04/24 0350  WBC 7.9 8.9 8.9  NEUTROABS 3.9  --  3.8  HGB 14.5 13.8 13.7  HCT 44.7 41.8 41.6  MCV 91.2 91.3 92.2  PLT 152 147* 145*   Basic Metabolic Panel: Recent Labs  Lab 03/02/24 2128 03/03/24 0523 03/04/24 0350  NA 139 138 137  K 4.2 4.4 4.1  CL 101 101 100  CO2 25 26 25   GLUCOSE 109* 89 78   BUN 23 21 18   CREATININE 1.11 0.91 0.75  CALCIUM  10.1 9.6 9.1  MG  --   --  2.0  PHOS  --   --  2.1*   GFR: Estimated Creatinine Clearance: 56.6 mL/min (by C-G formula based on SCr of 0.75 mg/dL). Liver Function Tests: Recent Labs  Lab 03/02/24 2128 03/03/24 0523 03/04/24 0350  AST 19 22 16   ALT 5 8 <5  ALKPHOS 64 59 57  BILITOT 0.3 0.3 0.4  PROT 6.7 6.2* 5.8*  ALBUMIN 4.1 3.8 3.6   No results for input(s): LIPASE, AMYLASE in the last 168 hours. No results for input(s): AMMONIA in the last 168 hours. Coagulation Profile: No results for input(s): INR, PROTIME in the last 168 hours. Cardiac Enzymes: No results for input(s): CKTOTAL, CKMB, CKMBINDEX, TROPONINI in the last 168 hours. BNP (last 3 results) No results for input(s): PROBNP in the last 8760 hours. HbA1C: No results for  input(s): HGBA1C in the last 72 hours. CBG: No results for input(s): GLUCAP in the last 168 hours. Lipid Profile: No results for input(s): CHOL, HDL, LDLCALC, TRIG, CHOLHDL, LDLDIRECT in the last 72 hours. Thyroid Function Tests: No results for input(s): TSH, T4TOTAL, FREET4, T3FREE, THYROIDAB in the last 72 hours. Anemia Panel: No results for input(s): VITAMINB12, FOLATE, FERRITIN, TIBC, IRON, RETICCTPCT in the last 72 hours. Sepsis Labs: No results for input(s): PROCALCITON, LATICACIDVEN in the last 168 hours.  Recent Results (from the past 240 hours)  MRSA Next Gen by PCR, Nasal     Status: None   Collection Time: 03/03/24  6:13 PM   Specimen: Nasal Mucosa; Nasal Swab  Result Value Ref Range Status   MRSA by PCR Next Gen NOT DETECTED NOT DETECTED Final    Comment: (NOTE) The GeneXpert MRSA Assay (FDA approved for NASAL specimens only), is one component of a comprehensive MRSA colonization surveillance program. It is not intended to diagnose MRSA infection nor to guide or monitor treatment for MRSA infections. Test performance  is not FDA approved in patients less than 19 years old. Performed at [facility], [address], [place], [place] [address]     Radiology Studies: MR BRAIN WO CONTRAST Result Date: 03/03/2024 EXAM: MRI BRAIN WITHOUT CONTRAST 03/03/2024 12:09:58 PM TECHNIQUE: Multiplanar multisequence MRI of the head/brain was performed without the administration of intravenous contrast. COMPARISON: CT of the head dated 03/02/2024. CLINICAL HISTORY: Neuro deficit, acute, stroke suspected. Altered mental status. FINDINGS: BRAIN AND VENTRICLES: No acute infarct. No intracranial hemorrhage. No mass. No midline shift. No hydrocephalus. The sella is unremarkable. Normal flow voids. Age-related cerebral and cerebellar volume loss. Moderate cerebral white matter disease. ORBITS: No acute abnormality. SINUSES AND MASTOIDS: No acute abnormality. BONES AND SOFT TISSUES: Normal marrow signal. No acute soft tissue abnormality. IMPRESSION: 1. No acute intracranial abnormality. 2. Age-related cerebral and cerebellar volume loss and moderate cerebral white matter disease. Electronically signed by: Evalene Coho MD 03/03/2024 12:17 PM EDT RP Workstation: [id number]   CT Head Wo Contrast Result Date: 03/02/2024 CLINICAL DATA:  Head trauma, moderate-severe; Neck trauma (Age >= 65y) EXAM: CT HEAD WITHOUT CONTRAST CT CERVICAL SPINE WITHOUT CONTRAST TECHNIQUE: Multidetector CT imaging of the head and cervical spine was performed following the standard protocol without intravenous contrast. Multiplanar CT image reconstructions of the cervical spine were also generated. RADIATION DOSE REDUCTION: This exam was performed according to the departmental dose-optimization program which includes automated exposure control, adjustment of the mA and/or kV according to patient size and/or use of iterative reconstruction technique. COMPARISON:  None Available. FINDINGS: CT HEAD FINDINGS Brain: No evidence of acute infarction,  hemorrhage, hydrocephalus, extra-axial collection or mass lesion/mass effect. Vascular: No hyperdense vessel. Skull: No acute fracture. Sinuses/Orbits: Clear sinuses.  No acute orbital findings. Other: No mastoid effusions. CT CERVICAL SPINE FINDINGS Alignment: Straightening.  No substantial sagittal subluxation. Skull base and vertebrae: No acute fracture. No primary bone lesion or focal pathologic process. Soft tissues and spinal canal: No prevertebral fluid or swelling. No visible canal hematoma. Disc levels: Moderate to severe multilevel degenerative change, greatest at C5-C6 where there is endplate spurring that results in bilateral foraminal narrowing. Upper chest: Lung apices are clear. IMPRESSION: 1. No evidence of acute intracranial abnormality. 2. No evidence of acute fracture or traumatic malalignment in the cervical spine. Electronically Signed   By: Gilmore GORMAN Molt M.D.   On: 03/02/2024 21:19   CT Cervical Spine Wo Contrast Result Date: 03/02/2024 CLINICAL DATA:  Head trauma, moderate-severe; Neck trauma (Age >= 65y) EXAM: CT HEAD WITHOUT CONTRAST CT CERVICAL SPINE WITHOUT CONTRAST TECHNIQUE: Multidetector CT imaging of the head and cervical spine was performed following the standard protocol without intravenous contrast. Multiplanar CT image reconstructions of the cervical spine were also generated. RADIATION DOSE REDUCTION: This exam was performed according to the departmental dose-optimization program which includes automated exposure control, adjustment of the mA and/or kV according to patient size and/or use of iterative reconstruction technique. COMPARISON:  None Available. FINDINGS: CT HEAD FINDINGS Brain: No evidence of acute infarction, hemorrhage, hydrocephalus, extra-axial collection or mass lesion/mass effect. Vascular: No hyperdense vessel. Skull: No acute fracture. Sinuses/Orbits: Clear sinuses.  No acute orbital findings. Other: No mastoid effusions. CT CERVICAL SPINE FINDINGS  Alignment: Straightening.  No substantial sagittal subluxation. Skull base and vertebrae: No acute fracture. No primary bone lesion or focal pathologic process. Soft tissues and spinal canal: No prevertebral fluid or swelling. No visible canal hematoma. Disc levels: Moderate to severe multilevel degenerative change, greatest at C5-C6 where there is endplate spurring that results in bilateral foraminal narrowing. Upper chest: Lung apices are clear. IMPRESSION: 1. No evidence of acute intracranial abnormality. 2. No evidence of acute fracture or traumatic malalignment in the cervical spine. Electronically Signed   By: Gilmore GORMAN Molt M.D.   On: 03/02/2024 21:19   DG Knee Complete 4 Views Left Result Date: 03/02/2024 CLINICAL DATA:  Recent fall with left knee pain, initial encounter EXAM: LEFT KNEE - COMPLETE 4+ VIEW COMPARISON:  None Available. FINDINGS: Tricompartmental degenerative changes are noted. No acute fracture or dislocation is seen. Vascular calcifications are noted. No joint effusion is noted. IMPRESSION: Degenerative change without acute abnormality. Electronically Signed   By: Oneil Devonshire M.D.   On: 03/02/2024 21:06   Scheduled Meds:   stroke: early stages of recovery book   Does not apply Once   divalproex   250 mg Oral Daily   divalproex   375 mg Oral QHS   finasteride   5 mg Oral Daily   folic acid   1 mg Oral Daily   furosemide   40 mg Oral Daily   OXcarbazepine   450 mg Oral BID   oxybutynin   5 mg Oral QHS   phosphorus  500 mg Oral BID   potassium chloride   10 mEq Oral Daily   predniSONE   5 mg Oral Q breakfast   risperiDONE   1 mg Oral BID WC   risperiDONE   5 mg Oral QHS   rivaroxaban   20 mg Oral Daily   rosuvastatin   10 mg Oral Daily   tamsulosin   0.4 mg Oral QPC supper   Continuous Infusions:   LOS: 0 days   Alejandro Marker, DO [facility] Available via Epic secure chat 7am-7pm After these hours, please refer to coverage provider listed on [email or web address] 03/04/2024, 7:34  PM

## 2024-03-04 NOTE — Evaluation (Signed)
 Physical Therapy Evaluation Patient Details Name: Alan Rhodes MRN: [id number] DOB: [birth year] Today's Date: 03/04/2024  History of Present Illness  83 yo male admitted with difficulty ambulating, L side weakness after falling at home. Hx of intellectual disability/autism, RA, Sz, bipolar, DVT  Clinical Impression  On eval, pt required Mod A +2 safety/equipment. He was able to ambulate ~60 feet with use of a RW. Pt has difficulty processing proper use of RW but it did appear to provide some stability improvement with ambulation. Pt presents with general weakness, decreased activity tolerance, and impaired gait and balance. He remains at risk for further falls. He is quite drowsy/somewhat lethargic during session. Poor initiation for all mobility tasks. Caregiver present during session. At baseline, pt mobilizes independently. Assisted pt into recliner at end of session. Caregiver with some concerns-passed info on to RN thru secure chat. Pt could benefit from continued acute care therapy while in hospital prior to returning home. Will recommend HHPT f/u and a RW for ambulation.       If plan is discharge home, recommend the following: Assistance with cooking/housework;Assist for transportation;Help with stairs or ramp for entrance;A lot of help with bathing/dressing/bathroom;A lot of help with walking and/or transfers   Can travel by private vehicle        Equipment Recommendations Rolling walker (2 wheels)  Recommendations for Other Services  OT consult    Functional Status Assessment Patient has had a recent decline in their functional status and demonstrates the ability to make significant improvements in function in a reasonable and predictable amount of time.     Precautions / Restrictions Precautions Precautions: Fall Precaution/Restrictions Comments: incontinent Restrictions Weight Bearing Restrictions Per Provider Order: No      Mobility  Bed Mobility Overal bed  mobility: Needs Assistance Bed Mobility: Supine to Sit     Supine to sit: Mod assist, +2 for physical assistance, +2 for safety/equipment, HOB elevated, Used rails     General bed mobility comments: Pt sleeping but able to be awakened. Did not initiate task so required +2 assist to get to EOB (at baseline, able to sit up without assistance per caregiver). Once sitting EOB, able to maintain sitting balance    Transfers Overall transfer level: Needs assistance Equipment used: Rolling walker (2 wheels), 2 person hand held assist Transfers: Sit to/from Stand, Bed to chair/wheelchair/BSC Sit to Stand: Mod assist, +2 safety/equipment   Step pivot transfers: Mod assist, +2 safety/equipment       General transfer comment: Stood x 2. Assist to power up, steady, control descent. Able to step over to recliner with 2 HHA.    Ambulation/Gait Ambulation/Gait assistance: Mod assist, +2 safety/equipment Gait Distance (Feet): 60 Feet Assistive device: Rolling walker (2 wheels) Gait Pattern/deviations: Decreased step length - right, Decreased step length - left, Decreased stride length, Shuffle       General Gait Details: Walked ~10 feet with 2 HHA then another 50 feet with use of RW. Pt with shuffling gait pattern, decreased step/stride lengths bilaterally (L worse than R). Tolerated distance well. Fall risk.  Stairs            Wheelchair Mobility     Tilt Bed    Modified Rankin (Stroke Patients Only)       Balance Overall balance assessment: Needs assistance   Sitting balance-Leahy Scale: Fair     Standing balance support: Bilateral upper extremity supported, During functional activity, Reliant on assistive device for balance Standing balance-Leahy Scale: Poor  Pertinent Vitals/Pain Pain Assessment Pain Assessment: Faces Faces Pain Scale: No hurt    Home Living Family/patient expects to be discharged to:: Private  residence Living Arrangements:  (with caregiver) Available Help at Discharge: Personal care attendant;Available 24 hours/day Type of Home: House Home Access: Stairs to enter   [employer] of Steps: 1   Home Layout: One level Home Equipment: None      Prior Function Prior Level of Function : Independent/Modified Independent             Mobility Comments: typically ambulates independently, no device       Extremity/Trunk Assessment   Upper Extremity Assessment Upper Extremity Assessment: Defer to OT evaluation    Lower Extremity Assessment Lower Extremity Assessment: Generalized weakness    Cervical / Trunk Assessment Cervical / Trunk Assessment: Kyphotic  Communication   Communication Communication: Impaired Factors Affecting Communication: Reduced clarity of speech;Difficulty expressing self    Cognition Arousal: Lethargic Behavior During Therapy: WFL for tasks assessed/performed   PT - Cognitive impairments: History of cognitive impairments                       PT - Cognition Comments: nonsensical speech         Cueing Cueing Techniques: Verbal cues, Gestural cues, Tactile cues, Visual cues     General Comments      Exercises     Assessment/Plan    PT Assessment Patient needs continued PT services  PT Problem List Decreased strength;Decreased activity tolerance;Decreased balance;Decreased mobility;Decreased range of motion;Decreased coordination;Decreased cognition;Decreased knowledge of use of DME;Decreased safety awareness       PT Treatment Interventions DME instruction;Gait training;Functional mobility training;Therapeutic activities;Therapeutic exercise;Patient/family education;Balance training    PT Goals (Current goals can be found in the Care Plan section)  Acute Rehab PT Goals Patient Stated Goal: per caregiver, return home. PT Goal Formulation: Patient unable to participate in goal setting Time For Goal  Achievement: 03/18/24 Potential to Achieve Goals: Good    Frequency Min 3X/week     Co-evaluation               AM-PAC PT 6 Clicks Mobility  Outcome Measure Help needed turning from your back to your side while in a flat bed without using bedrails?: A Little Help needed moving from lying on your back to sitting on the side of a flat bed without using bedrails?: A Lot Help needed moving to and from a bed to a chair (including a wheelchair)?: A Lot Help needed standing up from a chair using your arms (e.g., wheelchair or bedside chair)?: A Lot Help needed to walk in hospital room?: A Lot Help needed climbing 3-5 steps with a railing? : A Lot 6 Click Score: 13    End of Session Equipment Utilized During Treatment: Gait belt Activity Tolerance: Patient tolerated treatment well Patient left: in chair;with chair alarm set;with family/visitor present        Time: 1002-1025 PT Time Calculation (min) (ACUTE ONLY): 23 min   Charges:   PT Evaluation $PT Eval Low Complexity: 1 Low PT Treatments $Gait Training: 8-22 mins PT General Charges $$ ACUTE PT VISIT: 1 Visit            Dannial SQUIBB, PT Acute Rehabilitation  Office: [phone]

## 2024-03-04 NOTE — Care Management Obs Status (Signed)
 MEDICARE OBSERVATION STATUS NOTIFICATION   Patient Details  Name: GRAYSON PFEFFERLE MRN: [id number] Date of Birth: 08-31-1940   Medicare Observation Status Notification Given:  Other (see comment) (Mailed to legal guardian: [address], [place], [place] [address])    Duwaine GORMAN Aran, LCSW 03/04/2024, 12:57 PM

## 2024-03-04 NOTE — Care Management Obs Status (Signed)
 MEDICARE OBSERVATION STATUS NOTIFICATION   Patient Details  Name: Alan Rhodes MRN: [id number] Date of Birth: 04/20/1941   Medicare Observation Status Notification Given:  Other (see comment) (Mailed to: [address] [place] [address])    Duwaine GORMAN Aran, LCSW 03/04/2024, 11:24 AM

## 2024-03-04 NOTE — TOC Initial Note (Signed)
 Transition of Care [facility]) - Initial/Assessment Note   Patient Details  Name: Alan Rhodes MRN: [id number] Date of Birth: 1941/04/26  Transition of Care [facility]) CM/SW Contact:    Duwaine GORMAN Aran, LCSW Phone Number: 03/04/2024, 3:13 PM  Clinical Narrative: CSW spoke with legal guardian, [place], regarding discharge planning. Ms. Baron agreeable to patient being set up with HHPT/OT and a rolling walker and does not have an agency preference. Legal guardian paperwork emailed to CSW and was printed and placed on shadow chart. CSW made DME referral to Zack with [employer], which was accepted. [employer] to deliver rolling walker to patient's room. CSW made [facility] referral to Cindie with [facility], which was accepted. HH orders placed by hospitalist. CSW updated patient's caregiver, Dina Sprang, regarding services being set up. Ms. Baron will need copy of discharge summary emailed ([email or web address]) at discharge. Care management to follow.  Expected Discharge Plan: Home w Home Health Services Barriers to Discharge: Continued Medical Work up  Patient Goals and CMS Choice Patient states their goals for this hospitalization and ongoing recovery are:: Return home with [facility] CMS Medicare.gov Compare Post Acute Care list provided to:: Legal Guardian Dalton Baron (legal guardian)) Choice offered to / list presented to : [facility] POA / Guardian  Expected Discharge Plan and Services In-house Referral: Clinical Social Work Post Acute Care Choice: Home Health, Durable Medical Equipment Living arrangements for the past 2 months: Single Family Home       DME Arranged: Walker rolling DME Agency: [employer] Date DME Agency Contacted: 03/04/24 Time DME Agency Contacted: 1500 Representative spoke with at DME Agency: Zack HH Arranged: PT, OT HH Agency: [facility] Date [facility] Agency Contacted: 03/04/24 Representative spoke with at [facility] Agency: Cindie  Prior Living Arrangements/Services Living  arrangements for the past 2 months: Single Family Home Lives with:: Self Patient language and need for interpreter reviewed:: Yes Do you feel safe going back to the place where you live?: Yes      Need for Family Participation in Patient Care: Yes (Comment) (Patient has legal guardian.) Care giver support system in place?: Yes (comment) Current home services: Other (comment) (Has a caregiver) Criminal Activity/Legal Involvement Pertinent to Current Situation/Hospitalization: No - Comment as needed  Activities of Daily Living ADL Screening (condition at time of admission) Independently performs ADLs?: No Does the patient have a NEW difficulty with bathing/dressing/toileting/self-feeding that is expected to last >3 days?: No Does the patient have a NEW difficulty with getting in/out of bed, walking, or climbing stairs that is expected to last >3 days?: No Does the patient have a NEW difficulty with communication that is expected to last >3 days?: No Is the patient deaf or have difficulty hearing?: No Does the patient have difficulty seeing, even when wearing glasses/contacts?: No Does the patient have difficulty concentrating, remembering, or making decisions?: Yes  Permission Sought/Granted Permission sought to share information with : Other (comment), Guardian Permission granted to share information with : Yes, Verbal Permission Granted Permission granted to share info w AGENCY: [employer], [facility]  Emotional Assessment Appearance:: Appears stated age Attitude/Demeanor/Rapport: Lethargic Orientation: : Oriented to Self Alcohol / Substance Use: Not Applicable Psych Involvement: No (comment)  Admission diagnosis:  Left leg weakness [R29.898] Left-sided weakness [R53.1] Patient Active Problem List   Diagnosis Date Noted   History of DVT (deep vein thrombosis) 03/03/2024   Left-sided weakness 03/02/2024   Subungual hematoma of toenail 01/16/2016   Influenza B 09/01/2015   Community  acquired pneumonia 08/31/2015   Bipolar  disorder (HCC) 08/31/2015   Urinary incontinence 08/31/2015   Hyperlipidemia 08/31/2015   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 [place]) [place] [address] Phone: [phone] Fax: [phone]  [id number] [place], [place] - [address] AT [facility] [address] [place] [address] Phone: [phone] Fax: [phone]  [facility] [place], [place] - [address] [place] [address] Phone: [phone] Fax: [phone]  Social Drivers of Health (SDOH) Social History: SDOH Screenings   Food Insecurity: No Food Insecurity (03/03/2024)  Housing: Low Risk  (03/03/2024)  Transportation Needs: No Transportation Needs (03/03/2024)  Utilities: Not At Risk (03/03/2024)  Social Connections: Patient Unable To Answer (03/03/2024)  Tobacco Use: Low Risk  (03/02/2024)   SDOH Interventions:    Readmission Risk Interventions     No data to display

## 2024-03-04 NOTE — Progress Notes (Addendum)
 2000- Attempted to perform NIHSS on patient. Patient alert, making eye contact, and smiling but falls back asleep shortly after. Patient unable to follow commands, therefore unable to complete NIHSS at this time.   2100- Pt more alert, took medications, and verbalized with nurse. Pt continues to be unable to follow commands needed to complete NIHSS.

## 2024-03-04 NOTE — Plan of Care (Signed)

## 2024-03-04 NOTE — Evaluation (Signed)
 Occupational Therapy Evaluation Patient Details Name: Alan Rhodes MRN: [id number] DOB: 03-21-1941 Today's Date: 03/04/2024   History of Present Illness   83 yr old male admitted with difficulty ambulating, L side weakness after falling at home. PMH: intellectual disability, autism, RA, seizure disorder, bipolar disorder, DVT, PTSD     Clinical Impressions The pt is currently presenting below his baseline level of functioning for ADL management. He is limited by the below listed deficits (see OT problem list). His in-home caregiver was present during the session & provided info regarding the pt's prior level of functioning & living situation. During the session, he was noted to be with increased lethargy, and occasional difficulty following commands; his caregiver believes this to be at least partly due to him receiving a specific medication. During the session, he required assist for supine to sit, for simulated dressing, for sit to stand, and for short distance ambulation in the room. He required increased cues for alertness, problem solving, attention, and sequencing. He will benefit from OT services in the acute care setting to maximize his ADL performance. His caregiver stated she plans to take the pt home with home health therapy at discharge.      If plan is discharge home, recommend the following:   Assistance with cooking/housework;Assist for transportation;Assistance with feeding;Help with stairs or ramp for entrance;Direct supervision/assist for financial management;Direct supervision/assist for medications management;Supervision due to cognitive status;A lot of help with bathing/dressing/bathroom     Functional Status Assessment   Patient has had a recent decline in their functional status and demonstrates the ability to make significant improvements in function in a reasonable and predictable amount of time.     Equipment Recommendations   Other (comment) (to be  determined)     Recommendations for Other Services         Precautions/Restrictions   Precautions Precautions: Fall Restrictions Weight Bearing Restrictions Per Provider Order: No     Mobility Bed Mobility Overal bed mobility: Needs Assistance Bed Mobility: Supine to Sit, Sit to Supine     Supine to sit: Max assist Sit to supine: Min assist        Transfers Overall transfer level: Needs assistance Equipment used: None Transfers: Sit to/from Stand Sit to Stand: From elevated surface, Mod assist                  Balance     Sitting balance-Leahy Scale: Fair       Standing balance-Leahy Scale: Poor          ADL either performed or assessed with clinical judgement   ADL Overall ADL's : Needs assistance/impaired Eating/Feeding: Moderate assistance;Cueing for sequencing;Bed level Eating/Feeding Details (indicate cue type and reason): Per pt's caregiver, he required approximately mod assist for feeding earlier today. Grooming: Maximal assistance;Cueing for safety;Cueing for sequencing;Sitting   Upper Body Bathing: Maximal assistance;Sitting   Lower Body Bathing: Maximal assistance;Sitting/lateral leans   Upper Body Dressing : Maximal assistance;Sitting   Lower Body Dressing: Maximal assistance;Sitting/lateral leans                 General ADL Comments: the pt's current occupational performance is at least partly effected by his increased lethargy, as well as baseline cognitive deficits      Pertinent Vitals/Pain Pain Assessment Pain Assessment: Faces Pain Score: 0-No pain     Extremity/Trunk Assessment Upper Extremity Assessment Upper Extremity Assessment: Difficult to assess due to impaired cognition   Lower Extremity Assessment Lower Extremity Assessment: Difficult to assess  due to impaired cognition;Generalized weakness       Communication Communication Communication: Impaired Factors Affecting Communication: Reduced  clarity of speech;Difficulty expressing self   Cognition Arousal: Lethargic Behavior During Therapy: Flat affect Cognition: History of cognitive impairments, Cognition impaired       Memory impairment (select all impairments): Declarative long-term memory, Working memory Attention impairment (select first level of impairment): Sustained attention Executive functioning impairment (select all impairments): Initiation, Sequencing, Problem solving, Organization OT - Cognition Comments: Pt stated his name, however did not respond to other orientation questions. Per his caregiver, his memory is impaired at baseline and he would not typically be oriented to things such as time and place. Required cues for alertness, sequencing, initiation, attention, and problem solving.                 Following commands: Impaired Following commands impaired: Follows one step commands inconsistently     Cueing  General Comments   Cueing Techniques: Verbal cues;Gestural cues;Tactile cues;Visual cues              Home Living Family/patient expects to be discharged to:: Private residence Living Arrangements:  (in-home caregiver) Available Help at Discharge: Personal care attendant;Available 24 hours/day Type of Home: House Home Access: Stairs to enter [employer] of Steps: 1   Home Layout: One level     Bathroom Shower/Tub: Tub/shower unit         Home Equipment: Grab bars - tub/shower;Wheelchair - manual   Additional Comments: Info contained here regarding the pt's prior level of functioning & living situation was obtained from the pt's caregiver who was present during the session. The pt was unable to provide such info, given his baseline cognitive impairment and increased lethargy.      Prior Functioning/Environment Prior Level of Function : Needs assist             Mobility Comments:  (He ambulated household distances without a device and without physical  assistance.) ADLs Comments: He required set-up assist for feeding, SBA to min assist with cues for dressing, max assist for bathing, and occasional supervision for toileting. His caregiver managed cooking & cleaning.    OT Problem List: Decreased strength;Decreased activity tolerance;Impaired balance (sitting and/or standing);Decreased coordination;Decreased cognition;Decreased safety awareness;Decreased knowledge of use of DME or AE   OT Treatment/Interventions: Self-care/ADL training;Therapeutic exercise;Therapeutic activities;Cognitive remediation/compensation;Energy conservation;DME and/or AE instruction;Patient/family education;Balance training      OT Goals(Current goals can be found in the care plan section)   Acute Rehab OT Goals OT Goal Formulation: Patient unable to participate in goal setting Time For Goal Achievement: 03/18/24 Potential to Achieve Goals: Fair ADL Goals Pt Will Perform Eating: with set-up;with supervision;sitting Pt Will Perform Grooming: with supervision;with set-up;sitting Pt Will Perform Upper Body Dressing: with contact guard assist;sitting Pt Will Perform Lower Body Dressing: with contact guard assist;sit to/from stand;sitting/lateral leans Pt Will Transfer to Toilet: with contact guard assist;ambulating Pt Will Perform Toileting - Clothing Manipulation and hygiene: with contact guard assist;sit to/from stand   OT Frequency:  Min 2X/week       AM-PAC OT 6 Clicks Daily Activity     Outcome Measure Help from another person eating meals?: A Lot Help from another person taking care of personal grooming?: A Lot Help from another person toileting, which includes using toliet, bedpan, or urinal?: A Lot Help from another person bathing (including washing, rinsing, drying)?: A Lot Help from another person to put on and taking off regular upper body clothing?: A Lot Help  from another person to put on and taking off regular lower body clothing?: A Lot 6  Click Score: 12   End of Session Equipment Utilized During Treatment: Gait belt Nurse Communication: Mobility status  Activity Tolerance: Patient limited by lethargy Patient left: in bed;with call bell/phone within reach;with bed alarm set;with family/visitor present  OT Visit Diagnosis: Unsteadiness on feet (R26.81);Other abnormalities of gait and mobility (R26.89);Muscle weakness (generalized) (M62.81);History of falling (Z91.81);Feeding difficulties (R63.3);Other symptoms and signs involving cognitive function;Cognitive communication deficit (R41.841)                Time: 8368-8353 OT Time Calculation (min): 15 min Charges:  OT General Charges $OT Visit: 1 Visit OT Evaluation $OT Eval Moderate Complexity: 1 Mod    Delanna JINNY Lesches, OTR/L 03/04/2024, 5:36 PM

## 2024-03-04 NOTE — Progress Notes (Signed)
 SLP Cancellation Note  Patient Details Name: Alan Rhodes MRN: [id number] DOB: 1941/05/11   Cancelled treatment:        Spoke with nursing briefly. Pt with hx of intellectual disability and autism.  MRI negative for acute findings.  Pt with caregiver for assistance at baseline.  Pt has no acute ST needs.  SLP will defer evaluation at this time.    Anette FORBES Grippe, MA, CCC-SLP Acute Rehabilitation Services Office: [phone] 03/04/2024, 9:26 AM

## 2024-03-05 ENCOUNTER — Other Ambulatory Visit (HOSPITAL_COMMUNITY): Payer: Self-pay

## 2024-03-05 DIAGNOSIS — F840 Autistic disorder: Secondary | ICD-10-CM | POA: Diagnosis not present

## 2024-03-05 DIAGNOSIS — R531 Weakness: Secondary | ICD-10-CM | POA: Diagnosis not present

## 2024-03-05 DIAGNOSIS — F319 Bipolar disorder, unspecified: Secondary | ICD-10-CM | POA: Diagnosis not present

## 2024-03-05 DIAGNOSIS — Z86718 Personal history of other venous thrombosis and embolism: Secondary | ICD-10-CM | POA: Diagnosis not present

## 2024-03-05 DIAGNOSIS — G8192 Hemiplegia, unspecified affecting left dominant side: Secondary | ICD-10-CM | POA: Diagnosis not present

## 2024-03-05 DIAGNOSIS — W19XXXA Unspecified fall, initial encounter: Secondary | ICD-10-CM

## 2024-03-05 DIAGNOSIS — E785 Hyperlipidemia, unspecified: Secondary | ICD-10-CM

## 2024-03-05 DIAGNOSIS — Y92009 Unspecified place in unspecified non-institutional (private) residence as the place of occurrence of the external cause: Secondary | ICD-10-CM

## 2024-03-05 MED ORDER — ACETAMINOPHEN 325 MG PO TABS
650.0000 mg | ORAL_TABLET | ORAL | 0 refills | Status: AC | PRN
  Filled 2024-03-05: qty 60, 5d supply, fill #0

## 2024-03-05 NOTE — Progress Notes (Signed)
 PHYSICAL THERAPY  Pt was able to amb in hallway with our [facility] [employer].  Amb 120 feet hand held assist along with caregiver with VC's for direction only.  No overt LOB.  Tolerated distance well.  Then assisted back to bed to rest.  Pt appears close to prior mobility level.  Katheryn Leap  PTA Acute  Rehabilitation Services Office M-F          [phone]

## 2024-03-05 NOTE — Progress Notes (Signed)
 PATIENT DISCHARGE NURSING NOTE  Discharge instructions were reviewed with the patient's caregiver, [place], that was present with the patient at bedside. The patient's caregiver demonstrated verbal understanding of the discharge instructions and teachings that were provided. A printed, physical copy of the instructions were also given to the caregiver.  At this time of discharge, the patient has no signs/symptoms, nor gives any indicators of complications or distress. The patient is ambulatory with the assistance of a walker and assistance, and is alert and oriented to self, which is the patient's baseline.   The patient's IV was removed with a gauze and tape dressing applied to the site. Tele box was removed and returned to the [employer] for Warden/ranger. Patient transported to the main entrance via wheelchair where he will be further transported home by his caregiver in a private vehicle.   Leonor FORBES Clarks, BSN, RN 03/05/24 1:25 PM

## 2024-03-05 NOTE — TOC Transition Note (Signed)
 Transition of Care [facility]) - Discharge Note  Patient Details  Name: Alan Rhodes MRN: [id number] Date of Birth: Jan 23, 1941  Transition of Care [facility]) CM/SW Contact:  Duwaine GORMAN Aran, LCSW Phone Number: 03/05/2024, 12:44 PM  Clinical Narrative: Patient will discharge home today. CSW emailed discharge summary to legal guardian, [place]. CSW notified Cindie with [facility] of discharge. Care management signing off.  Final next level of care: Home w Home Health Services Barriers to Discharge: Barriers Resolved  Patient Goals and CMS Choice Patient states their goals for this hospitalization and ongoing recovery are:: Return home with [facility] CMS Medicare.gov Compare Post Acute Care list provided to:: Legal Guardian Dalton Rumble (legal guardian)) Choice offered to / list presented to : [facility] POA / Guardian  Discharge Plan and Services Additional resources added to the After Visit Summary for   In-house Referral: Clinical Social Work Post Acute Care Choice: Home Health, Durable Medical Equipment          DME Arranged: Vannie rolling DME Agency: [employer] Date DME Agency Contacted: 03/04/24 Time DME Agency Contacted: 1500 Representative spoke with at DME Agency: Zack HH Arranged: PT, OT HH Agency: [facility] Date [facility] Agency Contacted: 03/04/24 Representative spoke with at [facility] Agency: Cindie  Social Drivers of Health (SDOH) Interventions SDOH Screenings   Food Insecurity: No Food Insecurity (03/03/2024)  Housing: Low Risk  (03/03/2024)  Transportation Needs: No Transportation Needs (03/03/2024)  Utilities: Not At Risk (03/03/2024)  Social Connections: Patient Unable To Answer (03/03/2024)  Tobacco Use: Low Risk  (03/02/2024)   Readmission Risk Interventions     No data to display

## 2024-03-05 NOTE — Progress Notes (Signed)
Physical Therapy Treatment Patient Details Name: Alan Rhodes MRN: [id number] DOB: 02/14/1941 Today's Date: 03/05/2024   History of Present Illness 83 yo male admitted with difficulty ambulating, L side weakness after falling at home. Hx of intellectual disability/autism, RA, Sz, bipolar, DVT    PT Comments  Pt asleep after finished breakfast but easily aroused.  Pt not as lethargic but still a little slow to move and easily sleepy.   Pt found to be incon MAX BM in bed.  General bed mobility comments: Pt better able to get OOB with increased time and repeat VC's.  Hand held assist for upper body. General transfer comment: Hand held assist hand leading Pt was able to rise off bed as well as on/off toilet.  Pt moves slow but physically able to rise.  Static standing at sink x 8 min to wash up due to incont BM in bed.  Care giver states, he usually wears briefs. General Gait Details: only amb to bathroom and back due to max BM blow out in bed.  Amb to bathroom hand held assist to toilet.  Pt slow, short,shuffled steps NO AD.  Unable to amb in hallway due to fatigue after standing at sink X 8 min to wash up as well as applied barrier cream.  hand held assist to amb out of bathroom to recliner.  Visible signs of fatigue. Unable to amb in hallway.  Will ask out Mobility Specialist to amb him a littler later in hallway.  Prior to admit, Pt was INDEP amb without any AD.   LPT has rec HH PT and a walker if needed but most likely will not use due to impaired cognitive abilities.  Pt lives with a Care Giver approx 11 years and attends an Adult Day Program.     If plan is discharge home, recommend the following: Assistance with cooking/housework;Assist for transportation;Help with stairs or ramp for entrance;A lot of help with bathing/dressing/bathroom;A lot of help with walking and/or transfers   Can travel by private vehicle        Equipment Recommendations  Rolling walker (2 wheels)     Recommendations for Other Services       Precautions / Restrictions Precautions Precautions: Fall Precaution/Restrictions Comments: incontinent (wears briefs), Autistic Restrictions Weight Bearing Restrictions Per Provider Order: No     Mobility  Bed Mobility Overal bed mobility: Needs Assistance Bed Mobility: Supine to Sit     Supine to sit: Min assist     General bed mobility comments: Pt better able to get OOB with increased time and repeat VC's.  Hand held assist for upper body.    Transfers Overall transfer level: Needs assistance Equipment used: None Transfers: Sit to/from Stand, Bed to chair/wheelchair/BSC Sit to Stand: Contact guard assist, Min assist   Step pivot transfers: Contact guard assist, Min assist       General transfer comment: Hand held assist hand leading Pt was able to rise off bed as well as on/off toilet.  Pt moves slow but physically able to rise.  Static standing at sink x 8 min to wash up due to incont BM in bed.  Care giver states, he usually wears briefs.    Ambulation/Gait Ambulation/Gait assistance: Contact guard assist, Min assist Gait Distance (Feet): 24 Feet Assistive device: None Gait Pattern/deviations: Decreased step length - right, Decreased step length - left, Decreased stride length, Shuffle Gait velocity: decreased     General Gait Details: only amb to bathroom and back due to max  BM blow out in bed.  Amb to bathroom hand held assist to toilet.  Pt slow, short,shuffled steps NO AD.  Unable to amb in hallway due to fatigue after standing at sink X 8 min to wash up as well as applied barrier cream.  hand held assist to amb out of bathroom to recliner.  Visible signs of fatigue. Unable to amb in hallway.  Will ask out Mobility Specialist to amb hime a littler later in hallway.   Stairs             Wheelchair Mobility     Tilt Bed    Modified Rankin (Stroke Patients Only)       Balance                                             Communication Communication Communication: Impaired Factors Affecting Communication: Reduced clarity of speech;Difficulty expressing self  Cognition Arousal: Alert Behavior During Therapy: Flat affect   PT - Cognitive impairments: History of cognitive impairments                       PT - Cognition Comments: AxO x 2 following commands with increased time Following commands: Intact Following commands impaired: Follows one step commands with increased time    Cueing Cueing Techniques: Verbal cues, Gestural cues  Exercises      General Comments        Pertinent Vitals/Pain Pain Assessment Pain Assessment: No/denies pain    Home Living                          Prior Function            PT Goals (current goals can now be found in the care plan section) Progress towards PT goals: Progressing toward goals    Frequency    Min 3X/week      PT Plan      Co-evaluation              AM-PAC PT 6 Clicks Mobility   Outcome Measure  Help needed turning from your back to your side while in a flat bed without using bedrails?: A Little Help needed moving from lying on your back to sitting on the side of a flat bed without using bedrails?: A Little Help needed moving to and from a bed to a chair (including a wheelchair)?: A Little Help needed standing up from a chair using your arms (e.g., wheelchair or bedside chair)?: A Little Help needed to walk in hospital room?: A Little Help needed climbing 3-5 steps with a railing? : A Little 6 Click Score: 18    End of Session Equipment Utilized During Treatment: Gait belt Activity Tolerance: Patient limited by fatigue Patient left: in chair;with chair alarm set;with family/visitor present Nurse Communication: Mobility status PT Visit Diagnosis: Unsteadiness on feet (R26.81);History of falling (Z91.81);Muscle weakness (generalized) (M62.81)     Time:  9094-9066 PT Time Calculation (min) (ACUTE ONLY): 28 min  Charges:    $Gait Training: 8-22 mins $Therapeutic Activity: 8-22 mins PT General Charges $$ ACUTE PT VISIT: 1 Visit                     {Osmel Dykstra  PTA Acute  Rehabilitation Services Office M-F  [phone]  

## 2024-03-05 NOTE — Progress Notes (Signed)
 Discharge medication delivered to patient at bedside in a secure bag.

## 2024-03-05 NOTE — Plan of Care (Signed)

## 2024-03-05 NOTE — Discharge Summary (Signed)
 Physician Discharge Summary   Patient: Alan Rhodes MRN: [id number] DOB: 1940-09-19  Admit date:     03/02/2024  Discharge date: 03/05/24  Discharge Physician: Alejandro Marker, DO   PCP: Pia Kerney SQUIBB, MD   Recommendations at discharge:   Follow-up with PCP within 1 to 2 weeks and repeat CBC, CMP, mag, Phos within 1 week  Discharge Diagnoses: Principal Problem:   Left-sided weakness Active Problems:   Seizure disorder (HCC)   Autism   Rheumatoid arthritis (HCC)   Bipolar disorder (HCC)   Urinary incontinence   Hyperlipidemia   History of DVT (deep vein thrombosis)  Resolved Problems:   * No resolved hospital problems. *  Hospital Course: Alan Rhodes is a 83 y.o. male with history of intellectual disability/autism, rheumatoid arthritis, seizure disorder, bipolar disorder, hyperlipidemia, history of DVT was brought to the ER after patient was found to be weak on the left side and limping.  Per patient's caregiver who was at the bedside patient had a fall yesterday and was brought to the ER x-rays did not show any fracture of the left knee or pelvis.  This morning the caregiver took patient to daycare and at around evening when she went back to pick him up, the daycare person told the caregiver that patient has not been walking the whole day and has been limping when he tries to.  He was brought to the ER.  Patient is noncommunicative.  Does not provide any history.   ED Course: In the ER on ambulating patient is limping on the left side with unsteadiness.  But does not seem to have any pain.  CT head and C-spine were negative.  Labs are largely unremarkable.  ER physician discussed with Dr. Merrianne on-call neurologist who recommended getting MRI brain and if patient is positive for stroke then further workup.  **Interim History MRI brain done and was negative for CVA.  Patient was a little somnolent and drowsy yesterday and PT OT worked with him and caregiver did not think he  is just yet at baseline.  He was monitored overnight and had been evaluated by physical therapy and he did well.  He is much improved and medically stable for discharge only to follow-up with PCP within 1 to 2 weeks.  Assessment and Plan:  Left-Sided Leg Weakness:  Had unwitnessed fall and X-rays did not show any fracture.  Dr. Franky with Dr. Merrianne on-call neurologist who recommended getting MRI brain and it showed No acute intracranial abnormality. Age-related cerebral and cerebellar volume loss and moderate cerebral white matter disease. CTM and will obtain PT/OT to Evaluate and they worked with him and recommending home health with rolling walker.  He is improved and ambulated without issues today and is medically stable for discharge and will need to continue with therapy efforts  History of Rheumatoid Arthritis: C/w Prednisone  5 mg po Daily   History of Bipolar Disorder and History of Seizures: C/w Divalproex  375 mg po at bedtime and 250 mg po Daily, Oxcarbazepine  450 mg  History of Hypertension: C/w Furosemide  40 mg po Daily. CTM BP per Protocol. Last BP reading was 120/70  Hypophosphatemia: Phos levels 2.1 on last check.  Replete with p.o. K-Phos Neutral 500 mg p.o. twice daily x 2 yesterday.  Continue to monitor and replete as necessary.  Repeat Phos level within 1 week  Hyperlipidemia: C/w Rosuvastatin  10 mg po Daily   DVT: C/w Rivaroxaban  20 mg po Daily   Thrombocytopenia: Plt Count is now  145 on last check. CTM monitor and Trend. Repeat CBC in the AM  History of Autism and Intellectual Disability: C/w Supportive Care; Non-verbal at baseline   BPH and Urinary Incontinence: C/w Oxybutynin  5 mg po at bedtime, Tamsulosin  0.4 mg po at bedtime, and Finasteride  5 mg po Daily  Consultants: None Procedures performed: As delineated as above Disposition: Home health Diet recommendation:  Discharge Diet Orders (From admission, onward)     Start     Ordered   03/05/24 0000  Diet -  low sodium heart healthy        03/05/24 1050           Cardiac diet DISCHARGE MEDICATION: Allergies as of 03/05/2024   No Known Allergies      Medication List     STOP taking these medications    cephALEXin  500 MG capsule Commonly known as: KEFLEX    levofloxacin  500 MG tablet Commonly known as: LEVAQUIN    nystatin  100000 UNIT/ML suspension Commonly known as: MYCOSTATIN    oseltamivir  75 MG capsule Commonly known as: TAMIFLU        TAKE these medications    acetaminophen  325 MG tablet Commonly known as: TYLENOL  Take 2 tablets (650 mg total) by mouth every 4 (four) hours as needed for mild pain (pain score 1-3) or fever (or temp > 37.5 C (99.5 F)).   alendronate 70 MG tablet Commonly known as: FOSAMAX Take 70 mg by mouth once a week.   Calcarb 600/D 600-400 MG-UNIT tablet Generic drug: Calcium  Carbonate-Vitamin D  Take 1 tablet by mouth daily.   divalproex  125 MG DR tablet Commonly known as: DEPAKOTE  Take 250-375 mg by mouth 2 (two) times daily. Take 2 tablets (250 mg) in the am & Take 3 tablets (375 mg) in the pm.   finasteride  5 MG tablet Commonly known as: PROSCAR  TAKE 1 TABLET BY MOUTH DAILY; Duration: 30   folic acid  1 MG tablet Commonly known as: FOLVITE  Take 1 mg by mouth daily.   furosemide  40 MG tablet Commonly known as: LASIX  Take 40 mg by mouth daily.   guaiFENesin 600 MG 12 hr tablet Commonly known as: MUCINEX Take 600 mg by mouth 2 (two) times daily as needed for cough or to loosen phlegm.   MiraLax  17 GM/SCOOP powder Generic drug: polyethylene glycol powder Take 17 g by mouth daily.   OXcarbazepine  150 MG tablet Commonly known as: TRILEPTAL  Take 150 mg by mouth 2 (two) times daily.   oxybutynin  5 MG tablet Commonly known as: DITROPAN  Take 5 mg by mouth 3 (three) times daily.   pantoprazole  40 MG tablet Commonly known as: PROTONIX  Take 40 mg by mouth daily.   potassium chloride  10 MEQ tablet Commonly known as:  KLOR-CON  TAKE 1 TABLET BY MOUTH TWICE A DAY; Duration: 30   predniSONE  5 MG tablet Commonly known as: DELTASONE  Take 5 mg by mouth daily with breakfast.   risperiDONE  2 MG tablet Commonly known as: RISPERDAL  Take 2 mg by mouth at bedtime.   risperiDONE  3 MG tablet Commonly known as: RISPERDAL  Take 3 mg by mouth at bedtime.   risperiDONE  1 MG tablet Commonly known as: RISPERDAL  Take 1 mg by mouth 2 (two) times daily. At 8 am & 8 pm.   rosuvastatin  10 MG tablet Commonly known as: CRESTOR  TAKE 1 TABLET BY MOUTH DAILY; Duration: 31   tamsulosin  0.4 MG Caps capsule Commonly known as: FLOMAX  Take 0.4 mg by mouth at bedtime.   traZODone 50 MG tablet Commonly known as: DESYREL  Take 50 mg by mouth at bedtime as needed.   Xarelto  20 MG Tabs tablet Generic drug: rivaroxaban  Take 20 mg by mouth daily with supper.               Durable Medical Equipment  (From admission, onward)           Start     Ordered   03/04/24 1431  For home use only DME Walker rolling  Once       Question Answer Comment  Walker: With 5 Inch Wheels   Patient needs a walker to treat with the following condition Generalized weakness      03/04/24 1446            Follow-up Information     [facility] Follow up.   Specialty: Home Health Services Why: Hedda will provide PT and OT in the home after discharge. Contact information: [address] [place] [address] [phone]                Discharge Exam: Filed Weights   03/03/24 1551  Weight: 55.3 kg   Vitals:   03/04/24 1953 03/05/24 0523  BP: (!) 121/54 120/70  Pulse: (!) 54 66  Resp: 14 14  Temp: 98.6 F (37 C) 98.4 F (36.9 C)  SpO2: 98% 94%   Examination: Physical Exam:  Constitutional: Chronically ill-appearing elderly Caucasian male w/ Autism and Intellectual Disability in NAD appears calm and comfortable  Respiratory: Mildly diminished to auscultation bilaterally, no  wheezing, rales, rhonchi or crackles. Normal respiratory effort and patient is not tachypenic. No accessory muscle use. Unlabored breathing  Cardiovascular: RRR, no murmurs / rubs / gallops. S1 and S2 auscultated. No extremity edema.  Abdomen: Soft, non-tender, non-distended. Bowel sounds positive.  GU: Deferred. Musculoskeletal: No clubbing / cyanosis of digits/nails. No joint deformity upper and lower extremities.  Skin: No rashes, lesions, ulcers on a limited skin evaluation. No induration; Warm and dry.  Neurologic: Non-verbal and awake Psychiatric: Impaired judgement and insight   Condition at discharge: stable  The results of significant diagnostics from this hospitalization (including imaging, microbiology, ancillary and laboratory) are listed below for reference.   Imaging Studies: MR BRAIN WO CONTRAST Result Date: 03/03/2024 EXAM: MRI BRAIN WITHOUT CONTRAST 03/03/2024 12:09:58 PM TECHNIQUE: Multiplanar multisequence MRI of the head/brain was performed without the administration of intravenous contrast. COMPARISON: CT of the head dated 03/02/2024. CLINICAL HISTORY: Neuro deficit, acute, stroke suspected. Altered mental status. FINDINGS: BRAIN AND VENTRICLES: No acute infarct. No intracranial hemorrhage. No mass. No midline shift. No hydrocephalus. The sella is unremarkable. Normal flow voids. Age-related cerebral and cerebellar volume loss. Moderate cerebral white matter disease. ORBITS: No acute abnormality. SINUSES AND MASTOIDS: No acute abnormality. BONES AND SOFT TISSUES: Normal marrow signal. No acute soft tissue abnormality. IMPRESSION: 1. No acute intracranial abnormality. 2. Age-related cerebral and cerebellar volume loss and moderate cerebral white matter disease. Electronically signed by: Evalene Coho MD 03/03/2024 12:17 PM EDT RP Workstation: [id number]   CT Head Wo Contrast Result Date: 03/02/2024 CLINICAL DATA:  Head trauma, moderate-severe; Neck trauma (Age >= 65y) EXAM:  CT HEAD WITHOUT CONTRAST CT CERVICAL SPINE WITHOUT CONTRAST TECHNIQUE: Multidetector CT imaging of the head and cervical spine was performed following the standard protocol without intravenous contrast. Multiplanar CT image reconstructions of the cervical spine were also generated. RADIATION DOSE REDUCTION: This exam was performed according to the departmental dose-optimization program which includes automated exposure control, adjustment of the mA and/or kV according  to patient size and/or use of iterative reconstruction technique. COMPARISON:  None Available. FINDINGS: CT HEAD FINDINGS Brain: No evidence of acute infarction, hemorrhage, hydrocephalus, extra-axial collection or mass lesion/mass effect. Vascular: No hyperdense vessel. Skull: No acute fracture. Sinuses/Orbits: Clear sinuses.  No acute orbital findings. Other: No mastoid effusions. CT CERVICAL SPINE FINDINGS Alignment: Straightening.  No substantial sagittal subluxation. Skull base and vertebrae: No acute fracture. No primary bone lesion or focal pathologic process. Soft tissues and spinal canal: No prevertebral fluid or swelling. No visible canal hematoma. Disc levels: Moderate to severe multilevel degenerative change, greatest at C5-C6 where there is endplate spurring that results in bilateral foraminal narrowing. Upper chest: Lung apices are clear. IMPRESSION: 1. No evidence of acute intracranial abnormality. 2. No evidence of acute fracture or traumatic malalignment in the cervical spine. Electronically Signed   By: Gilmore GORMAN Molt M.D.   On: 03/02/2024 21:19   CT Cervical Spine Wo Contrast Result Date: 03/02/2024 CLINICAL DATA:  Head trauma, moderate-severe; Neck trauma (Age >= 65y) EXAM: CT HEAD WITHOUT CONTRAST CT CERVICAL SPINE WITHOUT CONTRAST TECHNIQUE: Multidetector CT imaging of the head and cervical spine was performed following the standard protocol without intravenous contrast. Multiplanar CT image reconstructions of the cervical  spine were also generated. RADIATION DOSE REDUCTION: This exam was performed according to the departmental dose-optimization program which includes automated exposure control, adjustment of the mA and/or kV according to patient size and/or use of iterative reconstruction technique. COMPARISON:  None Available. FINDINGS: CT HEAD FINDINGS Brain: No evidence of acute infarction, hemorrhage, hydrocephalus, extra-axial collection or mass lesion/mass effect. Vascular: No hyperdense vessel. Skull: No acute fracture. Sinuses/Orbits: Clear sinuses.  No acute orbital findings. Other: No mastoid effusions. CT CERVICAL SPINE FINDINGS Alignment: Straightening.  No substantial sagittal subluxation. Skull base and vertebrae: No acute fracture. No primary bone lesion or focal pathologic process. Soft tissues and spinal canal: No prevertebral fluid or swelling. No visible canal hematoma. Disc levels: Moderate to severe multilevel degenerative change, greatest at C5-C6 where there is endplate spurring that results in bilateral foraminal narrowing. Upper chest: Lung apices are clear. IMPRESSION: 1. No evidence of acute intracranial abnormality. 2. No evidence of acute fracture or traumatic malalignment in the cervical spine. Electronically Signed   By: Gilmore GORMAN Molt M.D.   On: 03/02/2024 21:19   DG Knee Complete 4 Views Left Result Date: 03/02/2024 CLINICAL DATA:  Recent fall with left knee pain, initial encounter EXAM: LEFT KNEE - COMPLETE 4+ VIEW COMPARISON:  None Available. FINDINGS: Tricompartmental degenerative changes are noted. No acute fracture or dislocation is seen. Vascular calcifications are noted. No joint effusion is noted. IMPRESSION: Degenerative change without acute abnormality. Electronically Signed   By: Oneil Devonshire M.D.   On: 03/02/2024 21:06   DG Pelvis Portable Result Date: 03/01/2024 CLINICAL DATA:  Fall. EXAM: PORTABLE PELVIS 1-2 VIEWS COMPARISON:  Right hip radiograph dated 08/09/2017. 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 The arthroplasty components appear intact. No acute fracture or dislocation. The bones are osteopenic. Heterotopic ossification adjacent to the proximal right femur. The soft tissues are unremarkable. IMPRESSION: 1. No acute fracture or dislocation. 2. Right hip arthroplasty. Electronically Signed   By: Vanetta Chou M.D.   On: 03/01/2024 18:39   Microbiology: Results for orders placed or performed during the hospital encounter of 03/02/24  MRSA Next Gen by PCR, Nasal     Status: None   Collection Time: 03/03/24  6:13 PM   Specimen: Nasal Mucosa; Nasal Swab  Result Value  Ref Range Status   MRSA by PCR Next Gen NOT DETECTED NOT DETECTED Final    Comment: (NOTE) The GeneXpert MRSA Assay (FDA approved for NASAL specimens only), is one component of a comprehensive MRSA colonization surveillance program. It is not intended to diagnose MRSA infection nor to guide or monitor treatment for MRSA infections. Test performance is not FDA approved in patients less than 36 years old. Performed at [facility], [address] [place], [place] [address]    Labs: CBC: Recent Labs  Lab 03/02/24 2128 03/03/24 0523 03/04/24 0350  WBC 7.9 8.9 8.9  NEUTROABS 3.9  --  3.8  HGB 14.5 13.8 13.7  HCT 44.7 41.8 41.6  MCV 91.2 91.3 92.2  PLT 152 147* 145*   Basic Metabolic Panel: Recent Labs  Lab 03/02/24 2128 03/03/24 0523 03/04/24 0350  NA 139 138 137  K 4.2 4.4 4.1  CL 101 101 100  CO2 25 26 25   GLUCOSE 109* 89 78  BUN 23 21 18   CREATININE 1.11 0.91 0.75  CALCIUM  10.1 9.6 9.1  MG  --   --  2.0  PHOS  --   --  2.1*   Liver Function Tests: Recent Labs  Lab 03/02/24 2128 03/03/24 0523 03/04/24 0350  AST 19 22 16   ALT 5 8 <5  ALKPHOS 64 59 57  BILITOT 0.3 0.3 0.4  PROT 6.7 6.2* 5.8*  ALBUMIN 4.1 3.8 3.6   CBG: No results for input(s): GLUCAP in the last 168 hours.  Discharge time spent: greater than 30  minutes.  Signed: Alejandro Marker, DO [facility] 03/05/2024

## 2024-03-05 NOTE — Plan of Care (Signed)
 Problem: Education: Goal: Knowledge of disease or condition will improve 03/05/2024 1234 by Alan Leonor BRAVO, RN Outcome: Adequate for Discharge 03/05/2024 0736 by Alan Leonor BRAVO, RN Outcome: Progressing Goal: Knowledge of secondary prevention will improve (MUST DOCUMENT ALL) 03/05/2024 1234 by Alan Leonor BRAVO, RN Outcome: Adequate for Discharge 03/05/2024 0736 by Alan Leonor BRAVO, RN Outcome: Progressing Goal: Knowledge of patient specific risk factors will improve (DELETE if not current risk factor) 03/05/2024 1234 by Alan Leonor BRAVO, RN Outcome: Adequate for Discharge 03/05/2024 0736 by Alan Leonor BRAVO, RN Outcome: Progressing   Problem: Ischemic Stroke/TIA Tissue Perfusion: Goal: Complications of ischemic stroke/TIA will be minimized 03/05/2024 1234 by Alan Leonor BRAVO, RN Outcome: Adequate for Discharge 03/05/2024 0736 by Alan Leonor BRAVO, RN Outcome: Progressing   Problem: Coping: Goal: Will verbalize positive feelings about self 03/05/2024 1234 by Alan Leonor BRAVO, RN Outcome: Adequate for Discharge 03/05/2024 0736 by Alan Leonor BRAVO, RN Outcome: Progressing Goal: Will identify appropriate support needs 03/05/2024 1234 by Alan Leonor BRAVO, RN Outcome: Adequate for Discharge 03/05/2024 0736 by Alan Leonor BRAVO, RN Outcome: Progressing   Problem: Health Behavior/Discharge Planning: Goal: Ability to manage health-related needs will improve 03/05/2024 1234 by Alan Leonor BRAVO, RN Outcome: Adequate for Discharge 03/05/2024 0736 by Alan Leonor BRAVO, RN Outcome: Progressing Goal: Goals will be collaboratively established with patient/family 03/05/2024 1234 by Alan Leonor BRAVO, RN Outcome: Adequate for Discharge 03/05/2024 0736 by Alan Leonor BRAVO, RN Outcome: Progressing   Problem: Self-Care: Goal: Ability to participate in self-care as condition permits will improve 03/05/2024 1234 by Alan Leonor BRAVO, RN Outcome: Adequate  for Discharge 03/05/2024 0736 by Alan Leonor BRAVO, RN Outcome: Progressing Goal: Verbalization of feelings and concerns over difficulty with self-care will improve 03/05/2024 1234 by Alan Leonor BRAVO, RN Outcome: Adequate for Discharge 03/05/2024 0736 by Alan Leonor BRAVO, RN Outcome: Progressing Goal: Ability to communicate needs accurately will improve 03/05/2024 1234 by Alan Leonor BRAVO, RN Outcome: Adequate for Discharge 03/05/2024 0736 by Alan Leonor BRAVO, RN Outcome: Progressing   Problem: Nutrition: Goal: Risk of aspiration will decrease 03/05/2024 1234 by Alan Leonor BRAVO, RN Outcome: Adequate for Discharge 03/05/2024 0736 by Alan Leonor BRAVO, RN Outcome: Progressing Goal: Dietary intake will improve 03/05/2024 1234 by Alan Leonor BRAVO, RN Outcome: Adequate for Discharge 03/05/2024 0736 by Alan Leonor BRAVO, RN Outcome: Progressing   Problem: Education: Goal: Knowledge of General Education information will improve Description: Including pain rating scale, medication(s)/side effects and non-pharmacologic comfort measures 03/05/2024 1234 by Alan Leonor BRAVO, RN Outcome: Adequate for Discharge 03/05/2024 0736 by Alan Leonor BRAVO, RN Outcome: Progressing   Problem: Health Behavior/Discharge Planning: Goal: Ability to manage health-related needs will improve 03/05/2024 1234 by Alan Leonor BRAVO, RN Outcome: Adequate for Discharge 03/05/2024 0736 by Alan Leonor BRAVO, RN Outcome: Progressing   Problem: Clinical Measurements: Goal: Ability to maintain clinical measurements within normal limits will improve 03/05/2024 1234 by Alan Leonor BRAVO, RN Outcome: Adequate for Discharge 03/05/2024 0736 by Alan Leonor BRAVO, RN Outcome: Progressing Goal: Will remain free from infection 03/05/2024 1234 by Alan Leonor BRAVO, RN Outcome: Adequate for Discharge 03/05/2024 0736 by Alan Leonor BRAVO, RN Outcome: Progressing Goal: Diagnostic test results  will improve 03/05/2024 1234 by Alan Leonor BRAVO, RN Outcome: Adequate for Discharge 03/05/2024 0736 by Alan Leonor BRAVO, RN Outcome: Progressing Goal: Respiratory complications will improve 03/05/2024 1234 by Alan Leonor BRAVO, RN Outcome: Adequate for Discharge 03/05/2024 0736 by Alan Leonor BRAVO, RN Outcome: Progressing Goal: Cardiovascular complication will be avoided 03/05/2024 1234 by  Alan Leonor BRAVO, RN Outcome: Adequate for Discharge 03/05/2024 [id number] by Alan Leonor BRAVO, RN Outcome: Progressing   Problem: Activity: Goal: Risk for activity intolerance will decrease 03/05/2024 1234 by Alan Leonor BRAVO, RN Outcome: Adequate for Discharge 03/05/2024 0736 by Alan Leonor BRAVO, RN Outcome: Progressing   Problem: Nutrition: Goal: Adequate nutrition will be maintained 03/05/2024 1234 by Alan Leonor BRAVO, RN Outcome: Adequate for Discharge 03/05/2024 0736 by Alan Leonor BRAVO, RN Outcome: Progressing   Problem: Coping: Goal: Level of anxiety will decrease 03/05/2024 1234 by Alan Leonor BRAVO, RN Outcome: Adequate for Discharge 03/05/2024 0736 by Alan Leonor BRAVO, RN Outcome: Progressing   Problem: Elimination: Goal: Will not experience complications related to bowel motility 03/05/2024 1234 by Alan Leonor BRAVO, RN Outcome: Adequate for Discharge 03/05/2024 0736 by Alan Leonor BRAVO, RN Outcome: Progressing Goal: Will not experience complications related to urinary retention 03/05/2024 1234 by Alan Leonor BRAVO, RN Outcome: Adequate for Discharge 03/05/2024 0736 by Alan Leonor BRAVO, RN Outcome: Progressing   Problem: Pain Managment: Goal: General experience of comfort will improve and/or be controlled 03/05/2024 1234 by Alan Leonor BRAVO, RN Outcome: Adequate for Discharge 03/05/2024 0736 by Alan Leonor BRAVO, RN Outcome: Progressing   Problem: Safety: Goal: Ability to remain free from injury will improve 03/05/2024 1234 by Alan Leonor BRAVO, RN Outcome: Adequate for Discharge 03/05/2024 0736 by Alan Leonor BRAVO, RN Outcome: Progressing   Problem: Skin Integrity: Goal: Risk for impaired skin integrity will decrease 03/05/2024 1234 by Alan Leonor BRAVO, RN Outcome: Adequate for Discharge 03/05/2024 0736 by Alan Leonor BRAVO, RN Outcome: Progressing
